# Patient Record
Sex: Female | Born: 1960 | Race: White | Hispanic: No | Marital: Married | Smoking: Never smoker
Health system: Southern US, Community
[De-identification: ages and names within clinical notes are randomized; demographics above are authoritative.]

## PROBLEM LIST (undated history)

## (undated) DIAGNOSIS — I471 Supraventricular tachycardia, unspecified: Secondary | ICD-10-CM

## (undated) DIAGNOSIS — J45990 Exercise induced bronchospasm: Secondary | ICD-10-CM

## (undated) DIAGNOSIS — R000 Tachycardia, unspecified: Secondary | ICD-10-CM

## (undated) DIAGNOSIS — R5383 Other fatigue: Secondary | ICD-10-CM

## (undated) DIAGNOSIS — L719 Rosacea, unspecified: Secondary | ICD-10-CM

## (undated) DIAGNOSIS — R0789 Other chest pain: Secondary | ICD-10-CM

## (undated) DIAGNOSIS — N951 Menopausal and female climacteric states: Secondary | ICD-10-CM

## (undated) HISTORY — DX: Menopausal and female climacteric states: N95.1

## (undated) HISTORY — DX: Supraventricular tachycardia, unspecified: I47.10

## (undated) HISTORY — DX: Rosacea, unspecified: L71.9

## (undated) HISTORY — DX: Tachycardia, unspecified: R00.0

## (undated) HISTORY — DX: Other chest pain: R07.89

## (undated) HISTORY — DX: Supraventricular tachycardia: I47.1

## (undated) HISTORY — DX: Other fatigue: R53.83

## (undated) HISTORY — DX: Exercise induced bronchospasm: J45.990

---

## 2005-07-27 ENCOUNTER — Ambulatory Visit: Payer: Self-pay | Admitting: Unknown Physician Specialty

## 2006-08-30 ENCOUNTER — Ambulatory Visit: Payer: Self-pay | Admitting: Unknown Physician Specialty

## 2007-09-06 ENCOUNTER — Ambulatory Visit: Payer: Self-pay | Admitting: Unknown Physician Specialty

## 2008-10-02 ENCOUNTER — Ambulatory Visit: Payer: Self-pay | Admitting: Unknown Physician Specialty

## 2009-03-27 ENCOUNTER — Encounter: Payer: Self-pay | Admitting: Internal Medicine

## 2009-07-11 ENCOUNTER — Encounter: Payer: Self-pay | Admitting: Internal Medicine

## 2009-08-08 ENCOUNTER — Encounter: Payer: Self-pay | Admitting: Internal Medicine

## 2009-08-11 ENCOUNTER — Encounter: Payer: Self-pay | Admitting: Internal Medicine

## 2009-09-10 ENCOUNTER — Ambulatory Visit: Payer: Self-pay | Admitting: Internal Medicine

## 2009-09-10 DIAGNOSIS — I498 Other specified cardiac arrhythmias: Secondary | ICD-10-CM

## 2009-10-06 ENCOUNTER — Ambulatory Visit: Payer: Self-pay | Admitting: Unknown Physician Specialty

## 2010-10-21 ENCOUNTER — Ambulatory Visit: Payer: Self-pay | Admitting: Unknown Physician Specialty

## 2010-12-21 ENCOUNTER — Ambulatory Visit: Payer: Self-pay | Admitting: Podiatry

## 2010-12-25 ENCOUNTER — Ambulatory Visit: Payer: Self-pay | Admitting: Podiatry

## 2011-02-26 ENCOUNTER — Ambulatory Visit: Payer: Self-pay | Admitting: Unknown Physician Specialty

## 2011-06-09 ENCOUNTER — Encounter: Payer: Self-pay | Admitting: Internal Medicine

## 2011-10-27 ENCOUNTER — Ambulatory Visit: Payer: Self-pay | Admitting: Unknown Physician Specialty

## 2012-09-21 ENCOUNTER — Ambulatory Visit: Payer: Self-pay | Admitting: Internal Medicine

## 2012-10-31 ENCOUNTER — Ambulatory Visit: Payer: Self-pay | Admitting: Internal Medicine

## 2013-11-13 ENCOUNTER — Ambulatory Visit: Payer: Self-pay | Admitting: Internal Medicine

## 2014-08-28 ENCOUNTER — Ambulatory Visit (INDEPENDENT_AMBULATORY_CARE_PROVIDER_SITE_OTHER): Payer: BC Managed Care – PPO

## 2014-08-28 ENCOUNTER — Ambulatory Visit (INDEPENDENT_AMBULATORY_CARE_PROVIDER_SITE_OTHER): Payer: BC Managed Care – PPO | Admitting: Podiatry

## 2014-08-28 ENCOUNTER — Encounter: Payer: Self-pay | Admitting: Podiatry

## 2014-08-28 VITALS — BP 121/65 | HR 61 | Resp 16 | Ht 67.0 in | Wt 130.0 lb

## 2014-08-28 DIAGNOSIS — D3610 Benign neoplasm of peripheral nerves and autonomic nervous system, unspecified: Secondary | ICD-10-CM

## 2014-08-28 NOTE — Progress Notes (Signed)
   Subjective:    Patient ID: Gina [place], female    DOB: 1961-03-15, 53 y.o.   MRN: [id number]  HPI Comments: Been having problems with my left foot , i am here for a second opinion  i do have a podiatrist and have been having treatment for my problem.  Having pain on the ball of foot in between the 3rd and 4th met . Been diagnosed and treated for a neuroma and the next step is surgery.  It is a sharp pain that seems to get worse when putting on my tennis shoes. And goes to a dull ache. It feels like walking on bone. I have had 4-5 alcohol injections. And it seemed to have flared up since stopping the injections, also noticed that the 4th toe seems to have moved and turning toward the other toe.   Foot Pain      Review of Systems  HENT:       Sinus problems   Allergic/Immunologic: Positive for environmental allergies and food allergies.  All other systems reviewed and are negative.      Objective:   Physical Exam: I have reviewed her past medical history medications allergies surgeries social history and review of systems. Pulses are strongly palpable bilateral. Neurologic sensorium is intact since once the monofilament she does have a palpable Mulder's click to the third interdigital space of the left foot. Deep tendon reflexes are intact bilateral muscle strength is 5 over 5 dorsiflexors plantar flexors inverters everters all intrinsic musculature is intact. Orthopedic evaluation Mr. is all joints distal to the ankle a full range of motion without crepitation. Cutaneous evaluation demonstrates supple well hydrated cutis no erythema edema saline is drainage or odor. Radiographic evaluation confirms a second metatarsal osteotomy with screw fixation a capital osteotomy with a single K wire fixation left foot as well as the arthroplasty fifth toe left foot.        Assessment & Plan:  Assessment: Neuroma third digitleft foot.  Plan: Discussed etiology pathology conservative  versus surgical therapies. At this point all conservative therapies have failed and surgery is indicated we discussed in great detail today surgical intervention consisting of 2 methods: 1. A deep intermetatarsal ligament release. 2. Excision of the neuroma. We discussed this in great detail today she understands the pros and cons of both like to have this performed but she will more than likely have her initial surgeon to work.

## 2014-10-11 IMAGING — US ABDOMEN ULTRASOUND LIMITED
1 series · 14 of 25 positions shown · non-contrast
Comparison: none

REASON FOR EXAM: LIVER elevated liver function
COMMENTS:

[Series 1: abdomen ultrasound limited · 0.20mm/px · 14 of 56 slices shown]
[im 1/56]
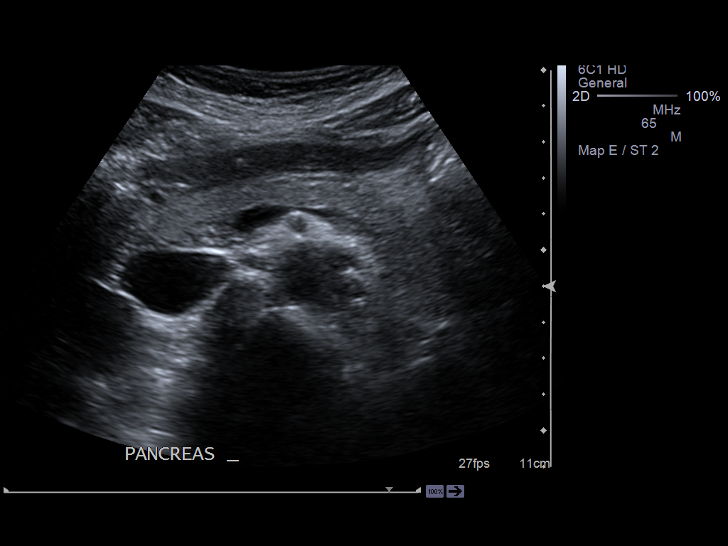
[im 5/56]
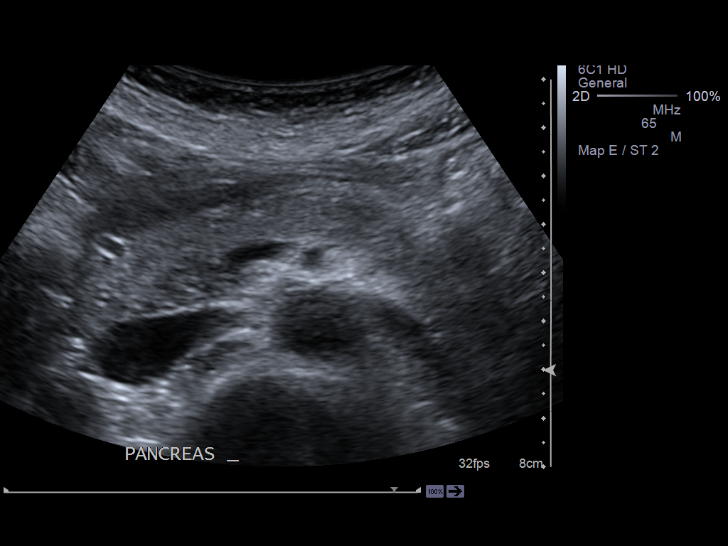
[im 10/56]
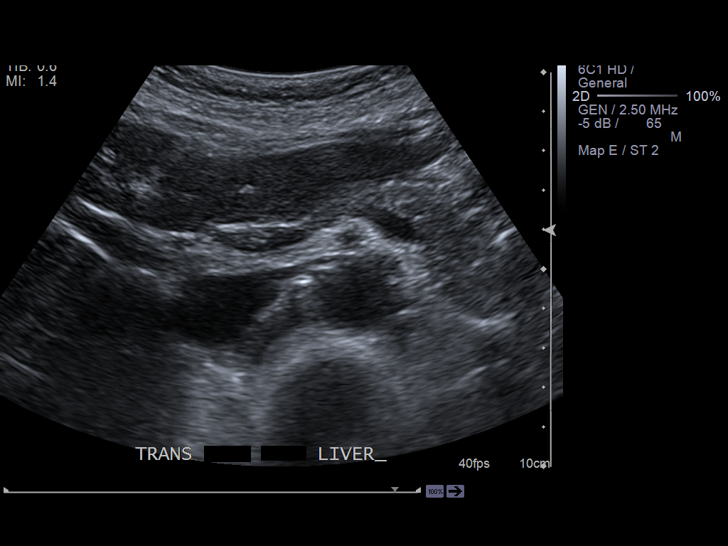
[im 14/56]
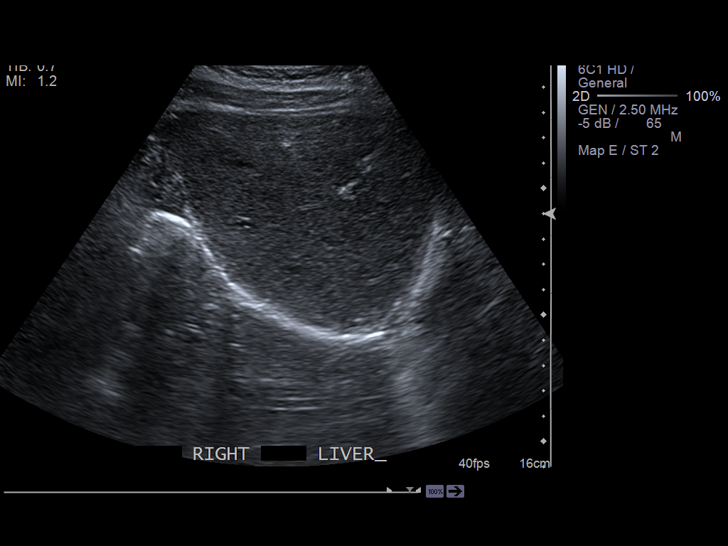
[im 19/56]
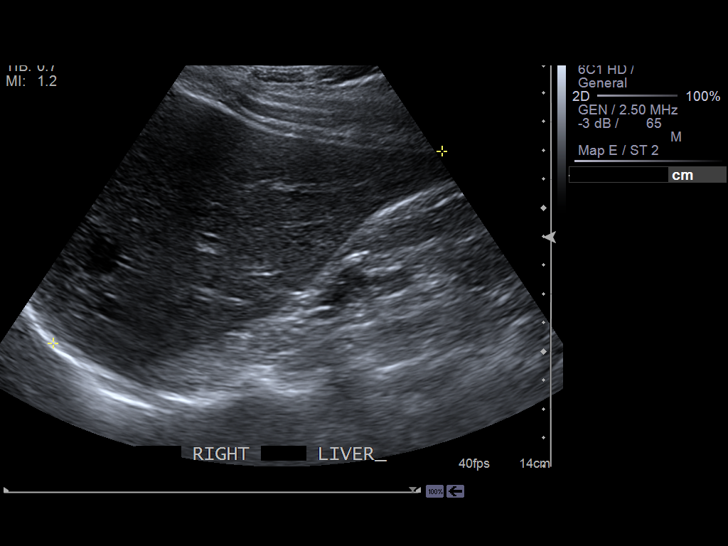
[im 21/56]
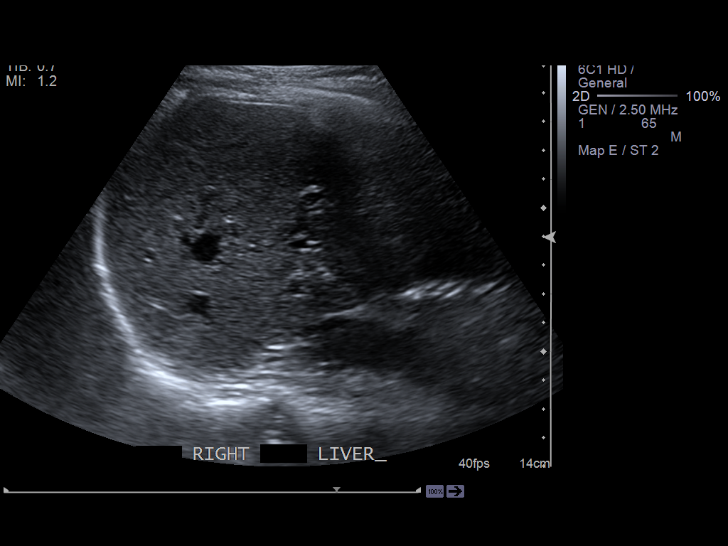
[im 26/56]
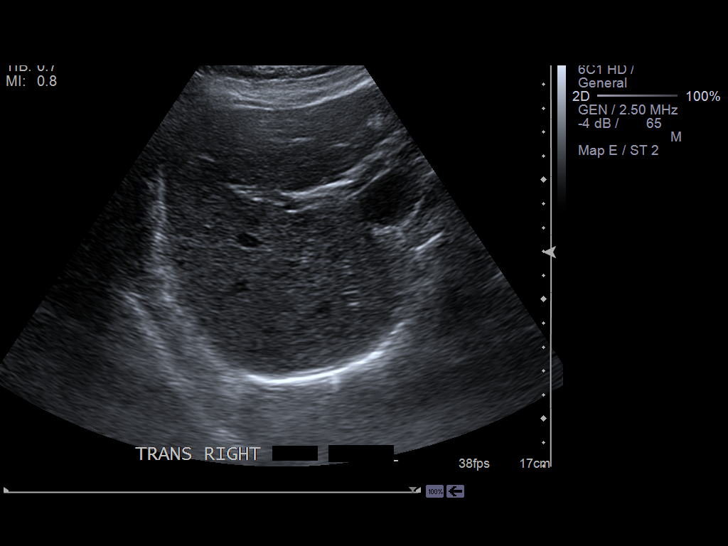
[im 30/56]
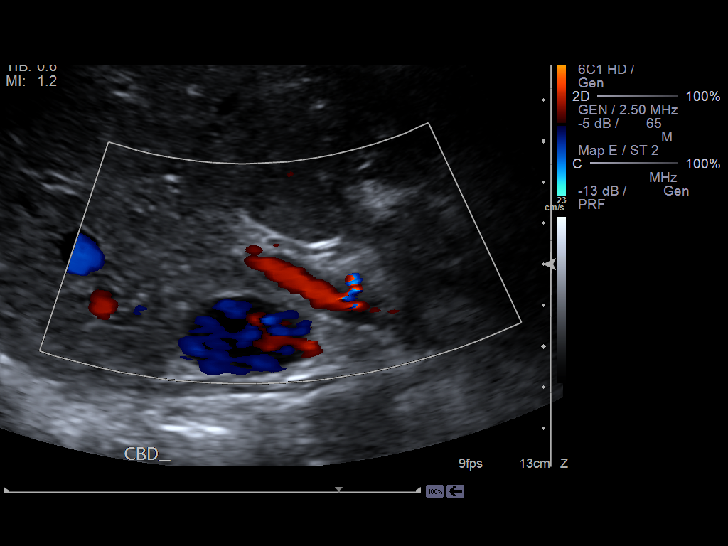
[im 35/56]
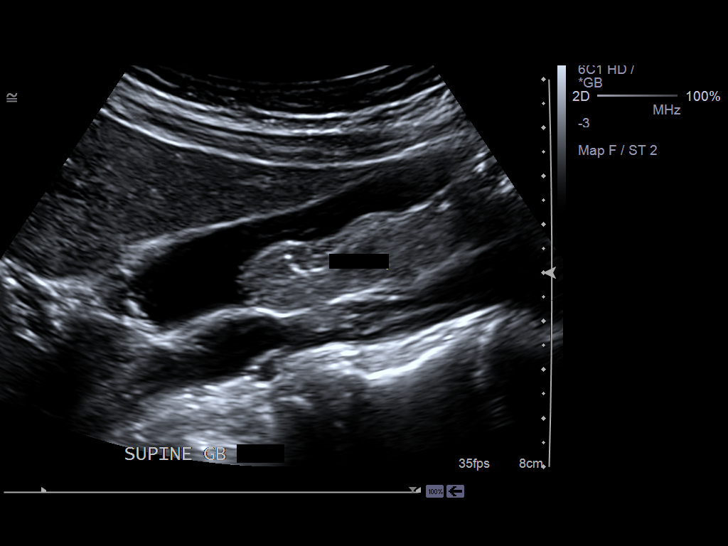
[im 37/56]
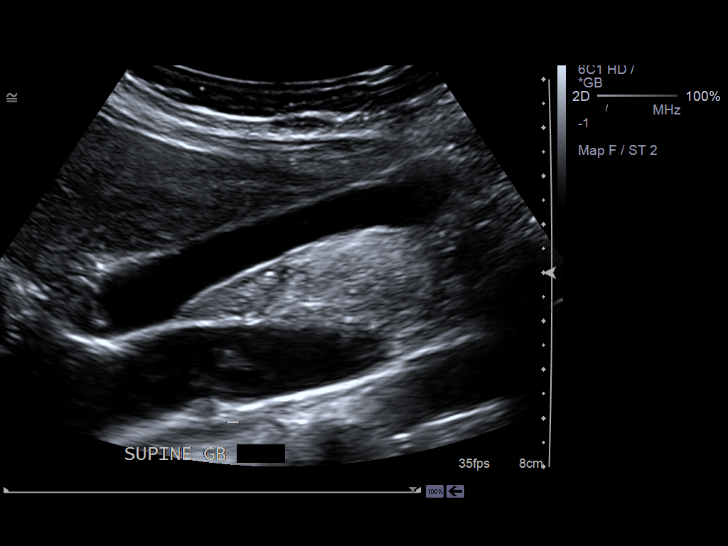
[im 42/56]
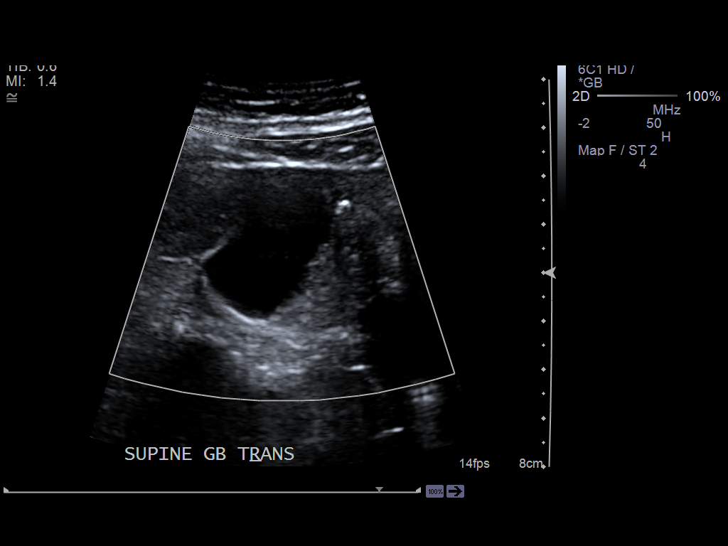
[im 46/56]
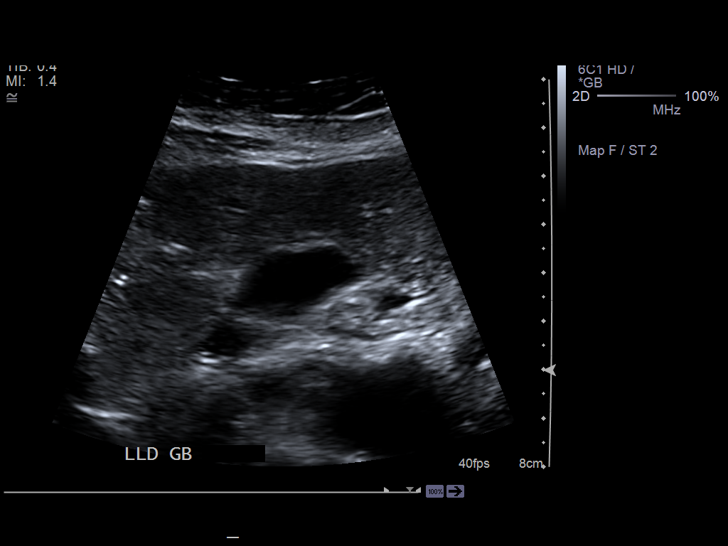
[im 51/56]
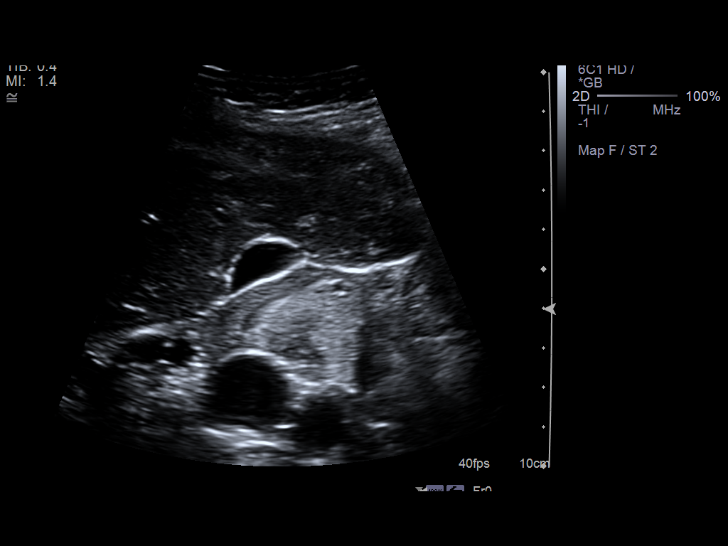
[im 56/56]
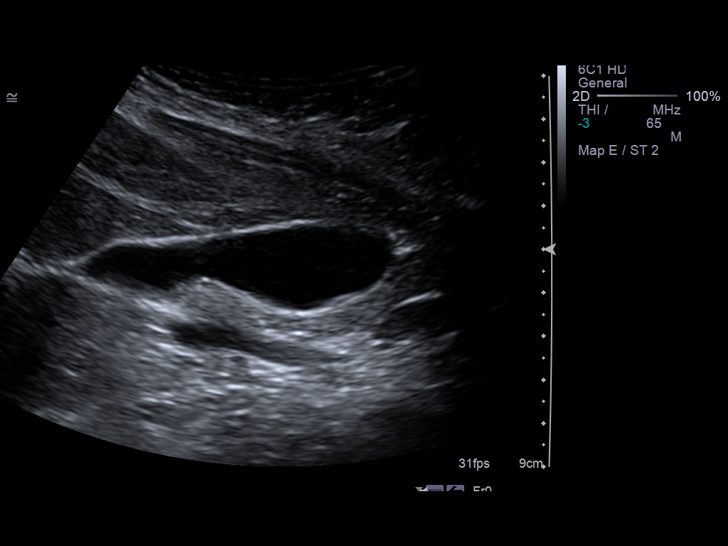

[14 of 25 positions shown; findings below may reference images not displayed]

PROCEDURE:     US  - US ABDOMEN LIMITED SURVEY  - September 21, 2012 [DATE]

RESULT:     A limited right upper quadrant ultrasound was performed.

The gallbladder is adequately distended with no evidence of stones, wall
thickening, or pericholecystic fluid. There is no positive sonographic
Murphy's sign. The common bile duct is normal at 4.3 mm in diameter.

The liver exhibits no focal mass or ductal dilation. Portal venous flow is
normal in direction toward the liver. The observed portions of the spleen
are normal.
IMPRESSION: The liver and gallbladder and observed portions of the
pancreas are normal in appearance.

[REDACTED]

## 2014-12-03 ENCOUNTER — Ambulatory Visit: Payer: Self-pay | Admitting: Internal Medicine

## 2014-12-17 ENCOUNTER — Ambulatory Visit: Payer: Self-pay | Admitting: Internal Medicine

## 2015-12-17 ENCOUNTER — Other Ambulatory Visit: Payer: Self-pay | Admitting: Internal Medicine

## 2015-12-17 DIAGNOSIS — Z1231 Encounter for screening mammogram for malignant neoplasm of breast: Secondary | ICD-10-CM

## 2015-12-24 ENCOUNTER — Ambulatory Visit: Payer: Self-pay

## 2015-12-25 ENCOUNTER — Ambulatory Visit
Admission: RE | Admit: 2015-12-25 | Discharge: 2015-12-25 | Disposition: A | Discharge status: Home / Self Care | Payer: BLUE CROSS/BLUE SHIELD | Source: Ambulatory Visit | Attending: Internal Medicine | Admitting: Internal Medicine

## 2015-12-25 ENCOUNTER — Ambulatory Visit: Payer: Self-pay

## 2015-12-25 DIAGNOSIS — Z1231 Encounter for screening mammogram for malignant neoplasm of breast: Secondary | ICD-10-CM | POA: Insufficient documentation

## 2015-12-30 ENCOUNTER — Other Ambulatory Visit: Payer: Self-pay | Admitting: Internal Medicine

## 2015-12-30 DIAGNOSIS — R928 Other abnormal and inconclusive findings on diagnostic imaging of breast: Secondary | ICD-10-CM

## 2015-12-31 ENCOUNTER — Ambulatory Visit
Admission: RE | Admit: 2015-12-31 | Discharge: 2015-12-31 | Disposition: A | Discharge status: Home / Self Care | Payer: BLUE CROSS/BLUE SHIELD | Source: Ambulatory Visit | Attending: Internal Medicine | Admitting: Internal Medicine

## 2015-12-31 DIAGNOSIS — R928 Other abnormal and inconclusive findings on diagnostic imaging of breast: Secondary | ICD-10-CM | POA: Insufficient documentation

## 2016-12-22 ENCOUNTER — Other Ambulatory Visit: Payer: Self-pay | Admitting: Internal Medicine

## 2016-12-22 DIAGNOSIS — Z1231 Encounter for screening mammogram for malignant neoplasm of breast: Secondary | ICD-10-CM

## 2017-01-19 ENCOUNTER — Ambulatory Visit: Payer: BLUE CROSS/BLUE SHIELD

## 2017-02-14 ENCOUNTER — Ambulatory Visit
Admission: RE | Admit: 2017-02-14 | Discharge: 2017-02-14 | Disposition: A | Discharge status: Home / Self Care | Payer: BLUE CROSS/BLUE SHIELD | Source: Ambulatory Visit | Attending: Internal Medicine | Admitting: Internal Medicine

## 2017-02-14 ENCOUNTER — Ambulatory Visit: Payer: BLUE CROSS/BLUE SHIELD

## 2017-02-14 ENCOUNTER — Other Ambulatory Visit: Payer: Self-pay | Admitting: Internal Medicine

## 2017-02-14 DIAGNOSIS — Z1231 Encounter for screening mammogram for malignant neoplasm of breast: Secondary | ICD-10-CM

## 2017-12-16 DEATH — deceased

## 2018-01-19 ENCOUNTER — Other Ambulatory Visit: Payer: Self-pay | Admitting: Internal Medicine

## 2018-01-19 DIAGNOSIS — Z1231 Encounter for screening mammogram for malignant neoplasm of breast: Secondary | ICD-10-CM

## 2018-02-16 ENCOUNTER — Ambulatory Visit
Admission: RE | Admit: 2018-02-16 | Discharge: 2018-02-16 | Disposition: A | Discharge status: Home / Self Care | Payer: BLUE CROSS/BLUE SHIELD | Source: Ambulatory Visit | Attending: Internal Medicine | Admitting: Internal Medicine

## 2018-02-16 DIAGNOSIS — Z1231 Encounter for screening mammogram for malignant neoplasm of breast: Secondary | ICD-10-CM | POA: Diagnosis present

## 2019-01-24 ENCOUNTER — Other Ambulatory Visit: Payer: Self-pay | Admitting: Internal Medicine

## 2019-01-24 DIAGNOSIS — Z1231 Encounter for screening mammogram for malignant neoplasm of breast: Secondary | ICD-10-CM

## 2020-01-28 ENCOUNTER — Other Ambulatory Visit: Payer: Self-pay | Admitting: Internal Medicine

## 2020-01-28 DIAGNOSIS — Z1231 Encounter for screening mammogram for malignant neoplasm of breast: Secondary | ICD-10-CM

## 2020-03-24 ENCOUNTER — Ambulatory Visit
Admission: RE | Admit: 2020-03-24 | Discharge: 2020-03-24 | Disposition: A | Discharge status: Home / Self Care | Payer: BC Managed Care – PPO | Source: Ambulatory Visit | Attending: Internal Medicine | Admitting: Internal Medicine

## 2020-03-24 DIAGNOSIS — Z1231 Encounter for screening mammogram for malignant neoplasm of breast: Secondary | ICD-10-CM | POA: Insufficient documentation

## 2020-05-20 ENCOUNTER — Other Ambulatory Visit: Payer: Self-pay

## 2020-05-20 ENCOUNTER — Ambulatory Visit: Payer: BC Managed Care – PPO | Admitting: Dermatology

## 2020-05-20 DIAGNOSIS — B070 Plantar wart: Secondary | ICD-10-CM | POA: Diagnosis not present

## 2020-05-20 DIAGNOSIS — D225 Melanocytic nevi of trunk: Secondary | ICD-10-CM | POA: Diagnosis not present

## 2020-05-20 DIAGNOSIS — D485 Neoplasm of uncertain behavior of skin: Secondary | ICD-10-CM

## 2020-05-20 DIAGNOSIS — L813 Cafe au lait spots: Secondary | ICD-10-CM

## 2020-05-20 DIAGNOSIS — L719 Rosacea, unspecified: Secondary | ICD-10-CM | POA: Diagnosis not present

## 2020-05-20 DIAGNOSIS — Z1283 Encounter for screening for malignant neoplasm of skin: Secondary | ICD-10-CM

## 2020-05-20 DIAGNOSIS — L814 Other melanin hyperpigmentation: Secondary | ICD-10-CM

## 2020-05-20 DIAGNOSIS — L578 Other skin changes due to chronic exposure to nonionizing radiation: Secondary | ICD-10-CM

## 2020-05-20 DIAGNOSIS — L821 Other seborrheic keratosis: Secondary | ICD-10-CM

## 2020-05-20 DIAGNOSIS — D229 Melanocytic nevi, unspecified: Secondary | ICD-10-CM

## 2020-05-20 DIAGNOSIS — D1800 Hemangioma unspecified site: Secondary | ICD-10-CM

## 2020-05-20 DIAGNOSIS — D235 Other benign neoplasm of skin of trunk: Secondary | ICD-10-CM

## 2020-05-20 MED ORDER — TRETINOIN 0.1 % EX CREA: TOPICAL_CREAM | Freq: Every evening | CUTANEOUS | 6 refills | Status: DC

## 2020-05-20 MED ORDER — SOOLANTRA 1 % EX CREA: TOPICAL_CREAM | CUTANEOUS | 11 refills | Status: DC

## 2020-05-20 MED ORDER — METRONIDAZOLE 1 % EX GEL: 1.0000 "application " | Freq: Every day | CUTANEOUS | 11 refills | Status: DC

## 2020-05-20 MED ORDER — DOXYCYCLINE 40 MG PO CPDR: 40.0000 mg | DELAYED_RELEASE_CAPSULE | Freq: Every day | ORAL | 11 refills | Status: DC

## 2020-05-20 NOTE — Patient Instructions (Signed)

## 2020-05-20 NOTE — Progress Notes (Signed)
Follow-Up Visit   Subjective  Gina [place] is a 59 y.o. female who presents for the following: Annual Exam, Rosacea (controlled with metronidazole 1% gel, Soolantra Cream, and Oracea 40mg .), wart (Left plantar foot, has been treated with LN2 in the past), and bump (left knee x ~2 years).  Gets irritated.  The following portions of the chart were reviewed this encounter and updated as appropriate:      Review of Systems:  No other skin or systemic complaints except as noted in HPI or Assessment and Plan.  Objective  Well appearing patient in no apparent distress; mood and affect are within normal limits.  A full examination was performed including scalp, head, eyes, ears, nose, lips, neck, chest, axillae, abdomen, back, buttocks, bilateral upper extremities, bilateral lower extremities, hands, feet, fingers, toes, fingernails, and toenails. All findings within normal limits unless otherwise noted below.  Objective  Face: Mild erythema on nasal tip.  Objective  Spinal Lower Back: 2.18mm med dark brown macule   Objective  Left Upper Lateral Knee: 5.20mm Firm light brown nodule.  Objective  Left Plantar Foot at [facility]: 2.27mm firm depressed macule.   Assessment & Plan  Rosacea Face  Controlled on treatment Continue metronidazole 1% gel QAM Continue Soolantra Cream QHS Continue doxycycline 40mg  take 1 po QD Continue tretinoin 0.1% cream Apply QHS as tolerated. Sunscreen qam  Doxycycline should be taken with food to prevent nausea. Do not lay down for 30 minutes after taking. Be cautious with sun exposure and use good sun protection while on this medication. Pregnant women should not take this medication.   Topical retinoid medications like tretinoin/Retin-A, adapalene/Differin, tazarotene/Fabior, and Epiduo/Epiduo Forte can cause dryness and irritation when first started. Only apply a pea-sized amount to the entire affected area. Avoid applying it around the eyes,  edges of mouth and creases at the nose. If you experience irritation, use a good moisturizer first and/or apply the medicine less often. If you are doing well with the medicine, you can increase how often you use it until you are applying every night. Be careful with sun protection while using this medication as it can make you sensitive to the sun. This medicine should not be used by pregnant women.     Ivermectin (SOOLANTRA) 1 % CREA - Face  tretinoin (RETIN-A) 0.1 % cream - Face  Nevus Spinal Lower Back  Benign-appearing.  Observation.  Call clinic for new or changing moles.  Recommend daily use of broad spectrum spf 30+ sunscreen to sun-exposed areas.    Neoplasm of uncertain behavior of skin Left Upper Lateral Knee  Epidermal / dermal shaving  Lesion diameter (cm):  0.5 Informed consent: discussed and consent obtained   Patient was prepped and draped in usual sterile fashion: Area prepped with alcohol. Anesthesia: the lesion was anesthetized in a standard fashion   Anesthetic:  1% lidocaine w/ epinephrine 1-100,000 buffered w/ 8.4% NaHCO3 Instrument used: flexible razor blade   Hemostasis achieved with: pressure, aluminum chloride and electrodesiccation   Outcome: patient tolerated procedure well   Post-procedure details: wound care instructions given   Post-procedure details comment:  Ointment and small bandage applied Additional details:  Post tx defect 0.7cm.  Specimen 1 - Surgical pathology Differential Diagnosis: Irritated Dermatofibroma vs other Check Margins: No Firm papule with dimple, 5.71mm  Discussed risk of recurrence and resulting scar. If recurs, may need to excise.  Plantar wart Left Plantar Foot at Ball  Vs Callous  Discussed viral etiology and risk of  spread.  Discussed multiple treatments may be required to clear warts.  Discussed possible post-treatment dyspigmentation and risk of recurrence.   Destruction of lesion - Left Plantar Foot at  Ball  Destruction method: cryotherapy   Informed consent: discussed and consent obtained   Lesion destroyed using liquid nitrogen: Yes   Region frozen until ice ball extended beyond lesion: Yes   Outcome: patient tolerated procedure well with no complications   Post-procedure details: wound care instructions given   Additional details:  Paring performed today.   Skin cancer screening performed today.  Actinic Damage - diffuse scaly erythematous macules with underlying dyspigmentation - Recommend daily broad spectrum sunscreen SPF 30+ to sun-exposed areas, reapply every 2 hours as needed.  - Call for new or changing lesions.  Melanocytic Nevi - Tan-brown and/or pink-flesh-colored symmetric macules and papules - R eyebrow - Benign appearing on exam today - Observation - Call clinic for new or changing moles - Recommend daily use of broad spectrum spf 30+ sunscreen to sun-exposed areas.   Lentigines - Scattered tan macules - Discussed due to sun exposure - Benign, observe - Call for any changes  Seborrheic Keratoses - Stuck-on, waxy, tan-brown papules and plaques  - Discussed benign etiology and prognosis. - Observe - Call for any changes  Hemangiomas - Red papules - Discussed benign nature - Observe - Call for any changes  Dermatofibroma - Firm pink/brown papulenodule with dimple sign on spinal lower back - Benign appearing - Call for any changes  Cafe au Lait  - Tan patch  - Genetic - Benign, observe - Call for any changes   Return in about 1 year (around 05/20/2021) for TBSE.  IJamesetta Orleans, CMA, am acting as scribe for Brendolyn Patty, MD .  Documentation: I have reviewed the above documentation for accuracy and completeness, and I agree with the above.  Brendolyn Patty MD

## 2020-05-22 ENCOUNTER — Telehealth: Payer: Self-pay

## 2020-05-22 NOTE — Telephone Encounter (Signed)
-----   Message from Brendolyn Patty, MD sent at 05/22/2020  8:46 AM EDT ----- Skin , left upper lateral knee PILAR CYST  Benign cyst

## 2020-05-22 NOTE — Telephone Encounter (Signed)
Advised pt of bx results/sh ?

## 2020-09-03 ENCOUNTER — Other Ambulatory Visit: Payer: Self-pay | Admitting: Dermatology

## 2021-01-14 ENCOUNTER — Other Ambulatory Visit: Payer: Self-pay

## 2021-01-14 ENCOUNTER — Ambulatory Visit: Payer: BC Managed Care – PPO | Admitting: Dermatology

## 2021-01-14 DIAGNOSIS — I8393 Asymptomatic varicose veins of bilateral lower extremities: Secondary | ICD-10-CM

## 2021-01-14 DIAGNOSIS — H01131 Eczematous dermatitis of right upper eyelid: Secondary | ICD-10-CM

## 2021-01-14 DIAGNOSIS — H01134 Eczematous dermatitis of left upper eyelid: Secondary | ICD-10-CM | POA: Diagnosis not present

## 2021-01-14 MED ORDER — PIMECROLIMUS 1 % EX CREA: TOPICAL_CREAM | Freq: Two times a day (BID) | CUTANEOUS | 3 refills | Status: DC

## 2021-01-14 NOTE — Progress Notes (Signed)
   Follow-Up Visit   Subjective  Gina Gonzales is a 60 y.o. female who presents for the following: Rash (Of upper eyelids x ~ 3 months - has used husband's ketoconazole shampoo and Desonide lotion) and Other (Veins of legs - would like to discuss treatment options).  The following portions of the chart were reviewed this encounter and updated as appropriate:   Tobacco  Allergies  Meds  Problems  Med Hx  Surg Hx  Fam Hx     Review of Systems:  No other skin or systemic complaints except as noted in HPI or Assessment and Plan.  Objective  Well appearing patient in no apparent distress; mood and affect are within normal limits.  A focused examination was performed including face, legs. Relevant physical exam findings are noted in the Assessment and Plan.  Objective  Bilateral upper eyelids: Pinkness with edema  Objective  Left Lower Leg - Anterior: Spider veins   Assessment & Plan  Eczematous dermatitis of upper eyelids of both eyes Bilateral upper eyelids Will plan patch testing in the future if not improving. pimecrolimus (ELIDEL) 1 % cream - Bilateral upper eyelids  Atopic dermatitis vs Contact Dermatitis vs Seborrheic Dermatitis Chronic and persistent.  Spider veins of both lower extremities Left Lower Leg - Anterior  Discussed sclerotherapy for spider vein treatment.  Discussed that it is a cosmetic procedure and is not covered by insurance ($350/treatment).  Treatment consists of injecting the spider veins with a medicine called Asclera to help them disappear.  Multiple treatments are generally necessary to get best results.  Risks including bruising and persistent discoloration due to post-inflammatory hyperpigmentation.  Sclerotherapy does not prevent the development of new spider veins.  Daily compression hose for two weeks after procedure is recommended.   Return for 5-6 weeks on a Monday or Tuesday.  I, Ashok Cordia, CMA, am acting as scribe for Sarina Ser, MD .  Documentation: I have reviewed the above documentation for accuracy and completeness, and I agree with the above.  Sarina Ser, MD

## 2021-01-15 ENCOUNTER — Encounter: Payer: Self-pay | Admitting: Dermatology

## 2021-02-09 ENCOUNTER — Ambulatory Visit: Payer: BC Managed Care – PPO | Admitting: Dermatology

## 2021-02-16 ENCOUNTER — Other Ambulatory Visit: Payer: Self-pay | Admitting: Internal Medicine

## 2021-02-16 ENCOUNTER — Ambulatory Visit: Payer: BC Managed Care – PPO | Admitting: Dermatology

## 2021-02-16 DIAGNOSIS — Z1231 Encounter for screening mammogram for malignant neoplasm of breast: Secondary | ICD-10-CM

## 2021-03-02 ENCOUNTER — Ambulatory Visit: Payer: BC Managed Care – PPO | Admitting: Dermatology

## 2021-04-07 ENCOUNTER — Ambulatory Visit
Admission: RE | Admit: 2021-04-07 | Discharge: 2021-04-07 | Disposition: A | Discharge status: Home / Self Care | Payer: BC Managed Care – PPO | Source: Ambulatory Visit | Attending: Internal Medicine | Admitting: Internal Medicine

## 2021-04-07 ENCOUNTER — Other Ambulatory Visit: Payer: Self-pay

## 2021-04-07 DIAGNOSIS — Z1231 Encounter for screening mammogram for malignant neoplasm of breast: Secondary | ICD-10-CM | POA: Insufficient documentation

## 2021-04-14 ENCOUNTER — Ambulatory Visit (INDEPENDENT_AMBULATORY_CARE_PROVIDER_SITE_OTHER): Payer: BC Managed Care – PPO

## 2021-04-14 ENCOUNTER — Ambulatory Visit: Payer: BC Managed Care – PPO | Admitting: Podiatry

## 2021-04-14 ENCOUNTER — Other Ambulatory Visit: Payer: Self-pay | Admitting: Dermatology

## 2021-04-14 ENCOUNTER — Other Ambulatory Visit: Payer: Self-pay

## 2021-04-14 DIAGNOSIS — S92504A Nondisplaced unspecified fracture of right lesser toe(s), initial encounter for closed fracture: Secondary | ICD-10-CM

## 2021-04-14 NOTE — Progress Notes (Signed)
   HPI: 60 y.o. female presenting today as a new patient for evaluation of an injury that the patient sustained to the right fifth toe approximately 3 weeks ago.  Patient states that she hit her toe against a piece of furniture.  It has been painful and swollen ever since.  She presents for further treatment and evaluation  Past Medical History:  Diagnosis Date  . Atypical chest pain   . Exercise-induced asthma   . Fatigue    hx  . Perimenopause   . Rosacea   . SVT (supraventricular tachycardia) ([place])   . Tachycardia      Physical Exam: General: The patient is alert and oriented x3 in no acute distress.  Dermatology: Skin is warm, dry and supple bilateral lower extremities. Negative for open lesions or macerations.  Vascular: Palpable pedal pulses bilaterally.  There is some edema localized to the right fifth toe.  No significant ecchymosis  Neurological: Epicritic and protective threshold grossly intact bilaterally.   Musculoskeletal Exam: No pedal deformities noted bilateral.  There is some associated tenderness to palpation to the right fifth toe  Radiographic Exam:  Normal osseous mineralization.  Closed, nondisplaced fracture of the proximal phalanx right fifth toe noted.  Overall the toes in good alignment however  Assessment: 1.  Fracture right fifth toe, closed, nondisplaced, initial encounter   Plan of Care:  1. Patient evaluated. X-Rays reviewed.  2.  Postsurgical shoe dispensed.  Weightbearing as tolerated 3.  Recommend ibuprofen as needed 4.  Explained to the patient that she may still be active however she may need to modify her exercises and daily activities 5.  Return to clinic in 6 weeks for follow-up x-ray      Edrick Kins, DPM [facility]  Dr. Edrick Kins, DPM    [address], [place] [address]                Office [phone]  Fax [phone]

## 2021-05-26 ENCOUNTER — Ambulatory Visit: Payer: BC Managed Care – PPO | Admitting: Podiatry

## 2021-06-02 ENCOUNTER — Ambulatory Visit: Payer: BC Managed Care – PPO | Admitting: Dermatology

## 2021-06-16 ENCOUNTER — Other Ambulatory Visit: Payer: Self-pay | Admitting: Dermatology

## 2021-06-16 DIAGNOSIS — L719 Rosacea, unspecified: Secondary | ICD-10-CM

## 2021-06-22 ENCOUNTER — Other Ambulatory Visit: Payer: Self-pay | Admitting: Dermatology

## 2021-06-22 DIAGNOSIS — L719 Rosacea, unspecified: Secondary | ICD-10-CM

## 2021-07-21 ENCOUNTER — Other Ambulatory Visit: Payer: Self-pay

## 2021-07-21 ENCOUNTER — Ambulatory Visit: Payer: BC Managed Care – PPO | Admitting: Dermatology

## 2021-07-21 DIAGNOSIS — L814 Other melanin hyperpigmentation: Secondary | ICD-10-CM

## 2021-07-21 DIAGNOSIS — L578 Other skin changes due to chronic exposure to nonionizing radiation: Secondary | ICD-10-CM | POA: Diagnosis not present

## 2021-07-21 DIAGNOSIS — D229 Melanocytic nevi, unspecified: Secondary | ICD-10-CM

## 2021-07-21 DIAGNOSIS — L810 Postinflammatory hyperpigmentation: Secondary | ICD-10-CM

## 2021-07-21 DIAGNOSIS — L719 Rosacea, unspecified: Secondary | ICD-10-CM | POA: Diagnosis not present

## 2021-07-21 DIAGNOSIS — Z1283 Encounter for screening for malignant neoplasm of skin: Secondary | ICD-10-CM | POA: Diagnosis not present

## 2021-07-21 DIAGNOSIS — L821 Other seborrheic keratosis: Secondary | ICD-10-CM

## 2021-07-21 DIAGNOSIS — D2361 Other benign neoplasm of skin of right upper limb, including shoulder: Secondary | ICD-10-CM

## 2021-07-21 DIAGNOSIS — D235 Other benign neoplasm of skin of trunk: Secondary | ICD-10-CM

## 2021-07-21 DIAGNOSIS — D225 Melanocytic nevi of trunk: Secondary | ICD-10-CM

## 2021-07-21 DIAGNOSIS — L219 Seborrheic dermatitis, unspecified: Secondary | ICD-10-CM | POA: Diagnosis not present

## 2021-07-21 DIAGNOSIS — L820 Inflamed seborrheic keratosis: Secondary | ICD-10-CM

## 2021-07-21 MED ORDER — KETOCONAZOLE 2 % EX CREA: TOPICAL_CREAM | CUTANEOUS | 3 refills | Status: AC

## 2021-07-21 MED ORDER — TRETINOIN 0.05 % EX CREA: TOPICAL_CREAM | Freq: Every day | CUTANEOUS | 3 refills | Status: DC

## 2021-07-21 MED ORDER — METRONIDAZOLE 0.75 % EX CREA: TOPICAL_CREAM | CUTANEOUS | 6 refills | Status: DC

## 2021-07-21 MED ORDER — ORACEA 40 MG PO CPDR: DELAYED_RELEASE_CAPSULE | ORAL | 11 refills | Status: DC

## 2021-07-21 MED ORDER — HYDROCORTISONE 2.5 % EX CREA: TOPICAL_CREAM | CUTANEOUS | 3 refills | Status: AC

## 2021-07-21 MED ORDER — SOOLANTRA 1 % EX CREA: TOPICAL_CREAM | CUTANEOUS | 5 refills | Status: DC

## 2021-07-21 NOTE — Progress Notes (Signed)
Follow-Up Visit   Subjective  Gina [place] is a 60 y.o. female who presents for the following: Annual Exam (Patient presents for TBSE today. No history of skin cancer or abnormal moles. She has a bump on her right upper arm that she noticed at the beginning of the year. No symptoms. She has pink, flaky areas of the face that started in January after she had Covid. She is using Elidel Cream as needed. Areas are not itchy on face. She also has Rosacea and is controlled using Metronidazole 0.75% cream QAM, Soolantra Cream QHS, and doxycycline '40mg'$  daily. ). Patient had fillers around her chin done by Dr Stephanie Coup in [place] at the end of April this year. She is unsure of the name of filler. She had a rash come up in these areas afterwards and they dissolved the filler, which she still has a little discoloration from.    The following portions of the chart were reviewed this encounter and updated as appropriate:       Review of Systems:  No other skin or systemic complaints except as noted in HPI or Assessment and Plan.  Objective  Well appearing patient in no apparent distress; mood and affect are within normal limits.  A full examination was performed including scalp, head, eyes, ears, nose, lips, neck, chest, axillae, abdomen, back, buttocks, bilateral upper extremities, bilateral lower extremities, hands, feet, fingers, toes, fingernails, and toenails. All findings within normal limits unless otherwise noted below.  Spinal lower back 2.65m med dark brown macule     face Pink patches with scale forehead, NL folds.   Inf Chin Light pink patches BL lower chin. Photos showed violaceous plaques same locations  Left Mid Back Clustered brown waxy papules.   Assessment & Plan  Skin cancer screening performed today.  Actinic Damage - chronic, secondary to cumulative UV radiation exposure/sun exposure over time - diffuse scaly erythematous macules with underlying  dyspigmentation - Recommend daily broad spectrum sunscreen SPF 30+ to sun-exposed areas, reapply every 2 hours as needed.  - Recommend staying in the shade or wearing long sleeves, sun glasses (UVA+UVB protection) and wide brim hats (4-inch brim around the entire circumference of the hat). - Call for new or changing lesions.  Lentigines - Scattered tan macules - Due to sun exposure - Benign-appering, observe - Recommend daily broad spectrum sunscreen SPF 30+ to sun-exposed areas, reapply every 2 hours as needed. - Call for any changes  Seborrheic Keratoses - Stuck-on, waxy, tan-brown papules and/or plaques  - Benign-appearing - Discussed benign etiology and prognosis. - Observe - Call for any changes  Dermatofibroma - Firm pink/brown papulenodule with dimple sign of the spinal lower back, right upper arm - Benign appearing - Call for any changes  Melanocytic Nevi - Tan-brown and/or pink-flesh-colored symmetric macules and papules of the spinal lower back - Benign appearing on exam today - Observation - Call clinic for new or changing moles - Recommend daily use of broad spectrum spf 30+ sunscreen to sun-exposed areas.   Rosacea face  Controlled on treatment  Rosacea is a chronic progressive skin condition usually affecting the face of adults, causing redness and/or acne bumps. It is treatable but not curable. It sometimes affects the eyes (ocular rosacea) as well. It may respond to topical and/or systemic medication and can flare with stress, sun exposure, alcohol, exercise and some foods.  Daily application of broad spectrum spf 30+ sunscreen to face is recommended to reduce flares.   Continue metronidazole 0.75%  Cream QAM Continue Soolantra Cream QHS Continue doxycycline '40mg'$  take 1 po QD Decrease tretinoin 0.05% cream for now until other facial rash resolved, then apply QHS as tolerated. Sunscreen qam  tretinoin (RETIN-A) 0.05 % cream - face Apply topically at  bedtime.  Related Medications tretinoin (RETIN-A) 0.1 % cream APPLY TOPICALLY AS DIRECTED AT BEDTIME  SOOLANTRA 1 % CREA APPLY TO FACE EVERY EVENING FOR ROSACEA  Nevus Spinal lower back  Benign-appearing.  Stable. Observation.  Call clinic for new or changing moles.  Recommend daily use of broad spectrum spf 30+ sunscreen to sun-exposed areas.   Seborrheic dermatitis face  Seborrheic Dermatitis with flare -  is a chronic persistent rash characterized by pinkness and scaling most commonly of the mid face but also can occur on the scalp (dandruff), ears; mid chest, mid back and groin.  It tends to be exacerbated by stress and cooler weather.  People who have neurologic disease may experience new onset or exacerbation of existing seborrheic dermatitis.  The condition is not curable but treatable and can be controlled.  Start ketoconazole 2% cream Apply qd/bid as directed  Start hydrocortisone 2.5% cream Apply qd/bid as directed dsp 30g  Mix hydrocortisone with ketaconazole 2% twice a day. If improved, decrease to hydrocortisone and ketaconazole mixed once a day. If still clear, decrease to ketaconazole only.  May use Elidel Cream (pt has) Prn flares in place of HC Cream.   ketoconazole (NIZORAL) 2 % cream - face Apply 1-2 times a day to affected areas face as directed.  hydrocortisone 2.5 % cream - face Apply 1-2 times a day to affected areas face as directed.  Post-inflammatory hyperpigmentation Inf Chin  Secondary to inflammatory reaction to previous filler injection done at another practice. Possible allergic reaction?  Pt had Covid months before filler injection, so doubt reaction from that.  Has had other fillers injected here in past without reaction  Patient will find out filler used to avoid for future use.   Inflamed seborrheic keratosis Left Mid Back  Residual post cryotherapy  Not bothersome. Will observe for now.   Return in about 1 year (around 07/21/2022) for  TBSE, rosacea, seb derm.  IJamesetta Orleans, CMA, am acting as scribe for Brendolyn Patty, MD . Documentation: I have reviewed the above documentation for accuracy and completeness, and I agree with the above.  Brendolyn Patty MD

## 2021-07-21 NOTE — Patient Instructions (Addendum)
VYCROSS - Voluma, Juvederm, Vollure, Volbella  Seborrheic Dermatitis  Mix hydrocortisone with ketaconazole 2% twice a day. If improved, decrease to hydrocortisone and ketaconazole mixed once a day. If still clear, decrease to ketaconazole only.  If you have any questions or concerns for your doctor, please call our main line at [phone] and press option 4 to reach your doctor's medical assistant. If no one answers, please leave a voicemail as directed and we will return your call as soon as possible. Messages left after 4 pm will be answered the following business day.   You may also send [place] a message via [place]. We typically respond to MyChart messages within 1-2 business days.  For prescription refills, please ask your pharmacy to contact our office. Our fax number is [phone].  If you have an urgent issue when the clinic is closed that cannot wait until the next business day, you can page your doctor at the number below.    Please note that while we do our best to be available for urgent issues outside of office hours, we are not available 24/7.   If you have an urgent issue and are unable to reach [place], you may choose to seek medical care at your doctor's office, retail clinic, urgent care center, or emergency room.  If you have a medical emergency, please immediately call 911 or go to the emergency department.  Pager Numbers  - Dr. Nehemiah Massed: [phone]  - Dr. Laurence Ferrari: [phone]  - Dr. Nicole Kindred: [phone]  In the event of inclement weather, please call our main line at [phone] for an update on the status of any delays or closures.  Dermatology Medication Tips: Please keep the boxes that topical medications come in in order to help keep track of the instructions about where and how to use these. Pharmacies typically print the medication instructions only on the boxes and not directly on the medication tubes.   If your medication is too expensive, please contact our  office at [phone] option 4 or send [place] a message through [place].   We are unable to tell what your co-pay for medications will be in advance as this is different depending on your insurance coverage. However, we may be able to find a substitute medication at lower cost or fill out paperwork to get insurance to cover a needed medication.   If a prior authorization is required to get your medication covered by your insurance company, please allow [place] 1-2 business days to complete this process.  Drug prices often vary depending on where the prescription is filled and some pharmacies may offer cheaper prices.  The website www.goodrx.com contains coupons for medications through different pharmacies. The prices here do not account for what the cost may be with help from insurance (it may be cheaper with your insurance), but the website can give you the price if you did not use any insurance.  - You can print the associated coupon and take it with your prescription to the pharmacy.  - You may also stop by our office during regular business hours and pick up a GoodRx coupon card.  - If you need your prescription sent electronically to a different pharmacy, notify our office through [facility] or by phone at [phone] option 4.

## 2021-08-19 ENCOUNTER — Ambulatory Visit (INDEPENDENT_AMBULATORY_CARE_PROVIDER_SITE_OTHER): Payer: Self-pay | Admitting: Dermatology

## 2021-08-19 ENCOUNTER — Other Ambulatory Visit: Payer: Self-pay

## 2021-08-19 DIAGNOSIS — I781 Nevus, non-neoplastic: Secondary | ICD-10-CM

## 2021-08-19 NOTE — Patient Instructions (Signed)
BEFORE YOUR APPOINTMENT FOR SCLEROTHERAPY  1. When you telephone for your appointment for the sclerotherapy procedure, please let the receptionist know that you are scheduling for the fifteen (15) minute sclerotherapy procedure not just a regular visit.  2. On the day of the procedure, please cleanse and dry the areas, but do not use any moisturizers or other products on the area(s) to be treated.  3. Bring a pair of comfortable shorts to wear during the procedure.  4. Be sure to bring your recommended graduated compression stockings with you to the office. You will be wearing them home when your visit is over. These compression hose can be purchased at most medical supply stores.  After Your Sclerotherapy Procedure  1. Please wear the graduated compression stockings for 24 hours immediately following the completion of the sclerotherapy procedure.  2. We recommend that you avoid vigorous activity as much as possible for the first twenty-four (24) hours. You can do your "normal" routine, but avoid an above normal amount of time on your feet. Elevating the legs when sitting and avoidance of vigorous leg movements or exercise in the first few days after treatment may improve your results.  3. You may remove the compression dressings (cotton balls) and tape the next morning.  4. Please continue wearing the compression stockings during waking hours for the two (2) weeks following sclerotherapy.  5. If you have any blisters, sores or ulcers or other problems following your procedure please call or return to the office immediately.     THE PROCEDURE FEE IS $350.00 PER FIFTEEN (15) MINUTE SESSION. WE REQUIRE THAT THIS PROCEDURE BE PAID FOR IN FULL ON OR BEFORE THE DATE THAT IT IS PERFORMED. WE WILL GIVE YOU A RECEIPT THAT YOU CAN FILE WITH YOUR INSURANCE COMPANY. WE GENERALLY DO NOT FILE THIS PROCEDURE WITH ANY INSURANCE COMPANY EXCEPT UNDER CERTAIN CIRCUMSTANCES WHERE PRIOR AUTHORIZATION HAS BEEN  CONFIRMED. THIS PROCEDURE IS GENERALLY CONSIDERED TO BE A COSMETIC PROCEDURE BY INSURANCE COMPANIES.  If you have any questions or concerns for your doctor, please call our main line at [phone] and press option 4 to reach your doctor's medical assistant. If no one answers, please leave a voicemail as directed and we will return your call as soon as possible. Messages left after 4 pm will be answered the following business day.   You may also send [place] a message via [place]. We typically respond to MyChart messages within 1-2 business days.  For prescription refills, please ask your pharmacy to contact our office. Our fax number is [phone].  If you have an urgent issue when the clinic is closed that cannot wait until the next business day, you can page your doctor at the number below.    Please note that while we do our best to be available for urgent issues outside of office hours, we are not available 24/7.   If you have an urgent issue and are unable to reach [place], you may choose to seek medical care at your doctor's office, retail clinic, urgent care center, or emergency room.  If you have a medical emergency, please immediately call 911 or go to the emergency department.  Pager Numbers  - Dr. Nehemiah Massed: [phone]  - Dr. Laurence Ferrari: [phone]  - Dr. Nicole Kindred: [phone]  In the event of inclement weather, please call our main line at [phone] for an update on the status of any delays or closures.  Dermatology Medication Tips: Please keep the boxes that topical medications come in  in order to help keep track of the instructions about where and how to use these. Pharmacies typically print the medication instructions only on the boxes and not directly on the medication tubes.   If your medication is too expensive, please contact our office at [phone] option 4 or send [place] a message through [place].   We are unable to tell what your co-pay for medications will be in advance  as this is different depending on your insurance coverage. However, we may be able to find a substitute medication at lower cost or fill out paperwork to get insurance to cover a needed medication.   If a prior authorization is required to get your medication covered by your insurance company, please allow [place] 1-2 business days to complete this process.  Drug prices often vary depending on where the prescription is filled and some pharmacies may offer cheaper prices.  The website www.goodrx.com contains coupons for medications through different pharmacies. The prices here do not account for what the cost may be with help from insurance (it may be cheaper with your insurance), but the website can give you the price if you did not use any insurance.  - You can print the associated coupon and take it with your prescription to the pharmacy.  - You may also stop by our office during regular business hours and pick up a GoodRx coupon card.  - If you need your prescription sent electronically to a different pharmacy, notify our office through [facility] or by phone at [phone] option 4.

## 2021-08-19 NOTE — Progress Notes (Signed)
   Follow-Up Visit   Subjective  Gina Gonzales is a 60 y.o. female who presents for the following: spider veins (Legs, pt presents for sclerotherapy).   The following portions of the chart were reviewed this encounter and updated as appropriate:   Tobacco  Allergies  Meds  Problems  Med Hx  Surg Hx  Fam Hx      Review of Systems:  No other skin or systemic complaints except as noted in HPI or Assessment and Plan.  Objective  Well appearing patient in no apparent distress; mood and affect are within normal limits.  A focused examination was performed including bil legs. Relevant physical exam findings are noted in the Assessment and Plan.  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  Procedure: The patient was counseled and understands about the effects, side effects and potential risks and complications of the sclerotherapy procedure. The patient was given the opportunity to ask questions. Asclera (polidocanol) 1% (total 1cc) was injected into the varices. In order to ensure correct placement of the catheter in the vein, I drew back slightly to give moderate blood show in the syringe. If there was any evidence or suspicion of extravasation of sclerosant, the area was immediately diluted with a large volume of 0.9% saline. A pressure dressing was applied immediately to the injected sites. The patient tolerated the procedure well without complication. The patient was instructed in post-operative compression stocking use. The patient understands to call or return  immediately if any problems noted.   Return if symptoms worsen or fail to improve.  I, Othelia Pulling, RMA, am acting as scribe for Sarina Ser, MD . Documentation: I have reviewed the above documentation for accuracy and completeness, and I agree with the above.  Sarina Ser, MD

## 2021-08-25 ENCOUNTER — Encounter: Payer: Self-pay | Admitting: Dermatology

## 2022-02-15 ENCOUNTER — Ambulatory Visit: Payer: BC Managed Care – PPO | Admitting: Dermatology

## 2022-02-15 DIAGNOSIS — H01131 Eczematous dermatitis of right upper eyelid: Secondary | ICD-10-CM | POA: Diagnosis not present

## 2022-02-15 DIAGNOSIS — L820 Inflamed seborrheic keratosis: Secondary | ICD-10-CM | POA: Diagnosis not present

## 2022-02-15 DIAGNOSIS — H01134 Eczematous dermatitis of left upper eyelid: Secondary | ICD-10-CM

## 2022-02-15 DIAGNOSIS — L719 Rosacea, unspecified: Secondary | ICD-10-CM | POA: Diagnosis not present

## 2022-02-15 DIAGNOSIS — L309 Dermatitis, unspecified: Secondary | ICD-10-CM

## 2022-02-15 DIAGNOSIS — L308 Other specified dermatitis: Secondary | ICD-10-CM | POA: Diagnosis not present

## 2022-02-15 DIAGNOSIS — L219 Seborrheic dermatitis, unspecified: Secondary | ICD-10-CM

## 2022-02-15 MED ORDER — PIMECROLIMUS 1 % EX CREA: TOPICAL_CREAM | CUTANEOUS | 3 refills | Status: DC

## 2022-02-15 MED ORDER — TRETINOIN 0.025 % EX CREA: TOPICAL_CREAM | CUTANEOUS | 6 refills | Status: DC

## 2022-02-15 NOTE — Progress Notes (Signed)
? ?Follow-Up Visit ?  ?Subjective  ?Gina Gonzales is a 61 y.o. female who presents for the following: Rash (Intermammary x 5 years off and on. Has tried Desowen lotion and antibiotic ointment, but neither helped. She gets bumps, worse with heat) and Dry skin (Under eyes x 2 weeks ago. Scaly, not itchy. Patient has allergies. ). She also has a few bumps on her right cheek. She has rosacea and uses metronidazole 0.75% cream and Soolantra Cream daily, but d/c doxycycline because she didn't know if she needed it.  ? ? ?The following portions of the chart were reviewed this encounter and updated as appropriate:  ?  ?  ? ?Review of Systems:  No other skin or systemic complaints except as noted in HPI or Assessment and Plan. ? ?Objective  ?Well appearing patient in no apparent distress; mood and affect are within normal limits. ? ?A focused examination was performed including chest, face. Relevant physical exam findings are noted in the Assessment and Plan. ? ?chest (8) ?Erythematous stuck-on, waxy papule ? ?R medial lower eyelid medial infraocular ?Erythema and scale ? ?face ?Mild erythema with tiny light pink papules of the right cheek ? ?face ?Clear today. Pt states spots keep coming back ? ? ? ?Assessment & Plan  ?Inflamed seborrheic keratosis (8) ?chest ? ?Destruction of lesion - chest ? ?Destruction method: cryotherapy   ?Informed consent: discussed and consent obtained   ?Lesion destroyed using liquid nitrogen: Yes   ?Region frozen until ice ball extended beyond lesion: Yes   ?Outcome: patient tolerated procedure well with no complications   ?Post-procedure details: wound care instructions given   ?Additional details:  Prior to procedure, discussed risks of blister formation, small wound, skin dyspigmentation, or rare scar following cryotherapy. Recommend Vaseline ointment to treated areas while healing. ? ? ?Periocular dermatitis ?R medial lower eyelid medial infraocular ? ?Likely Atopic, flare with  seasonal allergies ? ?Pimecrolimus cream Apply to AA rash under eyes twice daily until improved.  ? ? ?Eczematous dermatitis of upper eyelids of both eyes ? ?Related Medications ?pimecrolimus (ELIDEL) 1 % cream ?Apply to affected areas rash on face and under eyes once to twice daily until improved. ? ?Rosacea ?face ? ?Mild flare ? ?Rosacea is a chronic progressive skin condition usually affecting the face of adults, causing redness and/or acne bumps. It is treatable but not curable. It sometimes affects the eyes (ocular rosacea) as well. It may respond to topical and/or systemic medication and can flare with stress, sun exposure, alcohol, exercise and some foods.  Daily application of broad spectrum spf 30+ sunscreen to face is recommended to reduce flares. ? ?Continue metronidazole 0.75% cream qam face. ?Continue Soolantra Cream qhs face. ?Decrease strength of tretinoin 0.025% cream Apply a pea-sized amount to face qhs as tolerated. ?Hold Oracea for now ? ?tretinoin (RETIN-A) 0.025 % cream - face ?Apply a pea-sized amount to face nightly as tolerated. ? ?Related Medications ?tretinoin (RETIN-A) 0.1 % cream ?APPLY TOPICALLY AS DIRECTED AT BEDTIME ? ?tretinoin (RETIN-A) 0.05 % cream ?Apply topically at bedtime. ? ?SOOLANTRA 1 % CREA ?APPLY TO FACE EVERY EVENING FOR ROSACEA ? ?Seborrheic dermatitis ?face ? ?Vrs atopic dermatitis, not at goal ? ?Seborrheic Dermatitis  ?-  is a chronic persistent rash characterized by pinkness and scaling most commonly of the mid face but also can occur on the scalp (dandruff), ears; mid chest, mid back and groin.  It tends to be exacerbated by stress and cooler weather.  People who have neurologic disease  may experience new onset or exacerbation of existing seborrheic dermatitis.  The condition is not curable but treatable and can be controlled. ? ?Start Elidel Cream Apply qd/bid AAs face. ? ?Related Medications ?ketoconazole (NIZORAL) 2 % cream ?Apply 1-2 times a day to affected areas  face as directed. ? ?hydrocortisone 2.5 % cream ?Apply 1-2 times a day to affected areas face as directed. ? ? ?Return as scheduled. ? ?Documentation: I have reviewed the above documentation for accuracy and completeness, and I agree with the above. ? ?Brendolyn Patty MD  ? ? ?

## 2022-02-15 NOTE — Patient Instructions (Addendum)
Pimecrolimus Ointment - Apply to rash on face and under eyes once to twice daily until improved.  ? ?Ketoconazole 2% cream - May be used to rash/bumps between and under breasts once to twice daily.  ? ?Cryotherapy Aftercare ? ?Wash gently with soap and water everyday.   ?Apply Vaseline and Band-Aid daily until healed.  ? ?Seborrheic Keratosis ? ?What causes seborrheic keratoses? ?Seborrheic keratoses are harmless, common skin growths that first appear during adult life.  As time goes by, more growths appear.  Some people may develop a large number of them.  Seborrheic keratoses appear on both covered and uncovered body parts.  They are not caused by sunlight.  The tendency to develop seborrheic keratoses can be inherited.  They vary in color from skin-colored to gray, brown, or even black.  They can be either smooth or have a rough, warty surface.   ?Seborrheic keratoses are superficial and look as if they were stuck on the skin.  Under the microscope this type of keratosis looks like layers upon layers of skin.  That is why at times the top layer may seem to fall off, but the rest of the growth remains and re-grows.   ? ?Treatment ?Seborrheic keratoses do not need to be treated, but can easily be removed in the office.  Seborrheic keratoses often cause symptoms when they rub on clothing or jewelry.  Lesions can be in the way of shaving.  If they become inflamed, they can cause itching, soreness, or burning.  Removal of a seborrheic keratosis can be accomplished by freezing, burning, or surgery. ?If any spot bleeds, scabs, or grows rapidly, please return to have it checked, as these can be an indication of a skin cancer. ? ? ?If You Need Anything After Your Visit ? ?If you have any questions or concerns for your doctor, please call our main line at [phone] and press option 4 to reach your doctor's medical assistant. If no one answers, please leave a voicemail as directed and we will return your call as soon as  possible. Messages left after 4 pm will be answered the following business day.  ? ?You may also send [place] a message via MyChart. We typically respond to MyChart messages within 1-2 business days. ? ?For prescription refills, please ask your pharmacy to contact our office. Our fax number is [phone]. ? ?If you have an urgent issue when the clinic is closed that cannot wait until the next business day, you can page your doctor at the number below.   ? ?Please note that while we do our best to be available for urgent issues outside of office hours, we are not available 24/7.  ? ?If you have an urgent issue and are unable to reach [place], you may choose to seek medical care at your doctor's office, retail clinic, urgent care center, or emergency room. ? ?If you have a medical emergency, please immediately call 911 or go to the emergency department. ? ?Pager Numbers ? ?- Dr. Nehemiah Massed: [phone] ? ?- Dr. Laurence Ferrari: [phone] ? ?- Dr. Nicole Kindred: [phone] ? ?In the event of inclement weather, please call our main line at [phone] for an update on the status of any delays or closures. ? ?Dermatology Medication Tips: ?Please keep the boxes that topical medications come in in order to help keep track of the instructions about where and how to use these. Pharmacies typically print the medication instructions only on the boxes and not directly on the medication tubes.  ? ?  If your medication is too expensive, please contact our office at [phone] option 4 or send [place] a message through [place].  ? ?We are unable to tell what your co-pay for medications will be in advance as this is different depending on your insurance coverage. However, we may be able to find a substitute medication at lower cost or fill out paperwork to get insurance to cover a needed medication.  ? ?If a prior authorization is required to get your medication covered by your insurance company, please allow [place] 1-2 business days to complete this  process. ? ?Drug prices often vary depending on where the prescription is filled and some pharmacies may offer cheaper prices. ? ?The website www.goodrx.com contains coupons for medications through different pharmacies. The prices here do not account for what the cost may be with help from insurance (it may be cheaper with your insurance), but the website can give you the price if you did not use any insurance.  ?- You can print the associated coupon and take it with your prescription to the pharmacy.  ?- You may also stop by our office during regular business hours and pick up a GoodRx coupon card.  ?- If you need your prescription sent electronically to a different pharmacy, notify our office through [facility] or by phone at [phone] option 4. ? ? ? ? ?Si Usted Necesita Algo Despu?s de Su Visita ? ?Tambi?n puede enviarnos un mensaje a trav?s de MyChart. Por lo general respondemos a los mensajes de MyChart en el transcurso de 1 a 2 d?as h?biles. ? ?Para renovar recetas, por favor pida a su farmacia que se ponga en contacto con nuestra oficina. Nuestro n?mero de fax es el [phone]. ? ?Si tiene un asunto urgente cuando la cl?nica est? cerrada y que no puede esperar hasta el siguiente d?a h?bil, puede llamar/localizar a su doctor(a) al n?mero que aparece a continuaci?n.  ? ?Por favor, tenga en cuenta que aunque hacemos todo lo posible para estar disponibles para asuntos urgentes fuera del horario de oficina, no estamos disponibles las 24 horas del d?a, los 7 d?as de la semana.  ? ?Si tiene un problema urgente y no puede comunicarse con nosotros, puede optar por buscar atenci?n m?dica  en el consultorio de su doctor(a), en una cl?nica privada, en un centro de atenci?n urgente o en una sala de emergencias. ? ?Si tiene Engineer, maintenance (IT) m?dica, por favor llame inmediatamente al 911 o vaya a la sala de emergencias. ? ?N?meros de b?per ? ?- Dr. Nehemiah Massed: [phone] ? ?- Dra. Moye: [phone] ? ?- Dra.  Nicole Kindred: [phone] ? ?En caso de inclemencias del tiempo, por favor llame a nuestra l?nea principal al [phone] para una actualizaci?n sobre el estado de cualquier retraso o cierre. ? ?Consejos para la medicaci?n en dermatolog?a: ?Por favor, guarde las cajas en las que vienen los medicamentos de uso t?pico para ayudarle a seguir las instrucciones sobre d?nde y c?mo usarlos. Las farmacias generalmente imprimen las instrucciones del medicamento s?lo en las cajas y no directamente en los tubos del [place].  ? ?Si su medicamento es muy caro, por favor, p?ngase en contacto con Zigmund Daniel llamando al [phone] y presione la opci?n 4 o env?enos un mensaje a trav?s de MyChart.  ? ?No podemos decirle cu?l ser? su copago por los medicamentos por adelantado ya que esto es diferente dependiendo de la cobertura de su seguro. Sin embargo, es posible que podamos encontrar un medicamento sustituto a Geneticist, molecular  formulario para que el seguro cubra el medicamento que se considera necesario.  ? ?Si se requiere Ardelia Mems autorizaci?n previa para que su compa??a de seguros [place] su medicamento, por favor perm?tanos de 1 a 2 d?as h?biles para completar este proceso. ? ?Los precios de los medicamentos var?an con frecuencia dependiendo del Environmental consultant de d?nde se surte la receta y alguna farmacias pueden ofrecer precios m?s baratos. ? ?El sitio web www.goodrx.com tiene cupones para medicamentos de Airline pilot. Los precios aqu? no tienen en cuenta lo que podr?a costar con la ayuda del seguro (puede ser m?s barato con su seguro), pero el sitio web puede darle el precio si no utiliz? ning?n seguro.  ?- Puede imprimir el cup?n correspondiente y llevarlo con su receta a la farmacia.  ?- Tambi?n puede pasar por nuestra oficina durante el horario de atenci?n regular y recoger una tarjeta de cupones de GoodRx.  ?- Si necesita que su receta se env?e electr?nicamente a Chiropodist, informe a nuestra oficina a  trav?s de MyChart de [place] o por tel?fono llamando al [phone] y presione la opci?n 4. ? ?

## 2022-02-18 ENCOUNTER — Telehealth: Payer: Self-pay

## 2022-02-18 MED ORDER — TACROLIMUS 0.1 % EX OINT: TOPICAL_OINTMENT | Freq: Two times a day (BID) | CUTANEOUS | 0 refills | Status: DC

## 2022-02-18 NOTE — Telephone Encounter (Signed)
Pimcrolimus denied due to no trial of failure of Tacrolimus. Tacrolimus Ointment sent in as replacement. Patient advised. aw ?

## 2022-02-18 NOTE — Telephone Encounter (Signed)
Spoke with patient regarding fillers. She has had these done in the past with Dr. Nehemiah Massed but would really like to start seeing you for this.  ? ?I advised her I would have to get confirmation first to schedule and that you were out of the office but I would be back in touch with her around April 17th or 18th. ? ?Thank you ?

## 2022-02-25 NOTE — Telephone Encounter (Signed)
No office notes of fillers in [place].  ? ?Can schedule patient for a consult with you? ?

## 2022-02-26 ENCOUNTER — Other Ambulatory Visit: Payer: Self-pay | Admitting: Internal Medicine

## 2022-02-26 DIAGNOSIS — Z1231 Encounter for screening mammogram for malignant neoplasm of breast: Secondary | ICD-10-CM

## 2022-03-01 NOTE — Telephone Encounter (Signed)
Dr. Nicole Kindred did OK for me to schedule patient in a 1 hour slot for consult and treatment.  ? ?Patient called and scheduled. Advised her we are $13 per unit and fillers are anywhere from $650-700 per syringe.  ?

## 2022-03-22 ENCOUNTER — Ambulatory Visit (INDEPENDENT_AMBULATORY_CARE_PROVIDER_SITE_OTHER): Payer: Self-pay | Admitting: Dermatology

## 2022-03-22 DIAGNOSIS — L988 Other specified disorders of the skin and subcutaneous tissue: Secondary | ICD-10-CM

## 2022-03-22 NOTE — Patient Instructions (Signed)

## 2022-03-22 NOTE — Progress Notes (Signed)
? ?  Follow-Up Visit ?  ?Subjective  ?Gina Gonzales is a 61 y.o. 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 She had a reaction to her last filler around mouth in 02/2020 (Restylane Refyne), but had Covid four months prior. ? ? ?The following portions of the chart were reviewed this encounter and updated as appropriate:  ?  ?  ? ?Review of Systems:  No other skin or systemic complaints except as noted in HPI or Assessment and Plan. ? ?Objective  ?Well appearing patient in no apparent distress; mood and affect are within normal limits. ? ?A focused examination was performed including face. Relevant physical exam findings are noted in the Assessment and Plan. ? ?face ?Rhytides and volume loss.  ? ? ? ? ? ? ? ? ? ? ? ? ? ? ? ? ? ? ? ? ? ? ? ? ? ? ? ? ? ? ? ? ? ? ? ? ? ? ? ? ? ? ? ? ? ? ? ? ? ? ? ? ?Assessment & Plan  ?Elastosis of skin ?face ? ?Botox 20 units injected today to: ?- Frown complex 20 units ? ? ?Voluma 1cc injected today to: ?- Mid face, 0.5cc per side ? ?Botox Injection - face ?Location: See attached image ? ?Informed consent: Discussed risks (infection, pain, bleeding, bruising, swelling, allergic reaction, paralysis of nearby muscles, eyelid droop, double vision, neck weakness, difficulty breathing, headache, undesirable cosmetic result, and need for additional treatment) and benefits of the procedure, as well as the alternatives.  Informed consent was obtained. ? ?Preparation: The area was cleansed with alcohol. ? ?Procedure Details:  Botox was injected into the dermis with a 30-gauge needle. Pressure applied to any bleeding. Ice packs offered for swelling. ? ?Lot Number:  [id number] ?Expiration:  02/2024 ? ?Total Units Injected:  20 ? ?Plan: Patient was instructed to remain upright for 4 hours. Patient was instructed to avoid massaging the face and avoid vigorous exercise for the rest of the day. Tylenol may be used for headache.  Allow 2 weeks  before returning to clinic for additional dosing as needed. Patient will call for any problems. ? ? ?Filling material injection - face ?Prior to the procedure, the patient's past medical history, allergies and the rare but potential risks and complications were reviewed with the patient and a signed consent was obtained. Pre and post-treatment care was discussed and instructions provided. ? ?Location: cheeks ? ?Filler Type: Juvederm Voluma ? ?Procedure: The area was prepped thoroughly with Puracyn. After introducing the needle into the desired treatment area, the syringe plunger was drawn back to ensure there was no flash of blood prior to injecting the filler in order to minimize risk of intravascular injection and vascular occlusion. After injection of the filler, the treated areas were cleansed and iced to reduce swelling. Post-treatment instructions were reviewed with the patient.      ? ?Patient tolerated the procedure well. The patient will call with any problems, questions or concerns prior to their next appointment. ? ?Lot [id number] exp 12/23/2022 ? ? ?Return for as scheduled. ? ?I, Othelia Pulling, RMA, am acting as scribe for Brendolyn Patty, MD . ? ?Documentation: I have reviewed the above documentation for accuracy and completeness, and I agree with the above. ? ?Brendolyn Patty MD  ? ?

## 2022-04-13 ENCOUNTER — Ambulatory Visit
Admission: RE | Admit: 2022-04-13 | Discharge: 2022-04-13 | Disposition: A | Discharge status: Home / Self Care | Payer: BC Managed Care – PPO | Source: Ambulatory Visit | Attending: Internal Medicine | Admitting: Internal Medicine

## 2022-04-13 DIAGNOSIS — Z1231 Encounter for screening mammogram for malignant neoplasm of breast: Secondary | ICD-10-CM | POA: Diagnosis present

## 2022-07-27 ENCOUNTER — Ambulatory Visit (INDEPENDENT_AMBULATORY_CARE_PROVIDER_SITE_OTHER): Payer: BC Managed Care – PPO | Admitting: Dermatology

## 2022-07-27 DIAGNOSIS — L719 Rosacea, unspecified: Secondary | ICD-10-CM

## 2022-07-27 DIAGNOSIS — Z1283 Encounter for screening for malignant neoplasm of skin: Secondary | ICD-10-CM

## 2022-07-27 DIAGNOSIS — D225 Melanocytic nevi of trunk: Secondary | ICD-10-CM | POA: Diagnosis not present

## 2022-07-27 DIAGNOSIS — L814 Other melanin hyperpigmentation: Secondary | ICD-10-CM

## 2022-07-27 DIAGNOSIS — D235 Other benign neoplasm of skin of trunk: Secondary | ICD-10-CM

## 2022-07-27 DIAGNOSIS — L821 Other seborrheic keratosis: Secondary | ICD-10-CM

## 2022-07-27 DIAGNOSIS — H01134 Eczematous dermatitis of left upper eyelid: Secondary | ICD-10-CM

## 2022-07-27 DIAGNOSIS — H01131 Eczematous dermatitis of right upper eyelid: Secondary | ICD-10-CM

## 2022-07-27 DIAGNOSIS — D229 Melanocytic nevi, unspecified: Secondary | ICD-10-CM

## 2022-07-27 DIAGNOSIS — L578 Other skin changes due to chronic exposure to nonionizing radiation: Secondary | ICD-10-CM

## 2022-07-27 DIAGNOSIS — D2361 Other benign neoplasm of skin of right upper limb, including shoulder: Secondary | ICD-10-CM

## 2022-07-27 MED ORDER — ORACEA 40 MG PO CPDR: DELAYED_RELEASE_CAPSULE | ORAL | 11 refills | Status: DC

## 2022-07-27 MED ORDER — METRONIDAZOLE 0.75 % EX CREA: TOPICAL_CREAM | CUTANEOUS | 5 refills | Status: DC

## 2022-07-27 MED ORDER — SOOLANTRA 1 % EX CREA: TOPICAL_CREAM | CUTANEOUS | 5 refills | Status: DC

## 2022-07-27 NOTE — Progress Notes (Signed)
Follow-Up Visit   Subjective  Gina Gonzales is a 61 y.o. female who presents for the following: Annual Exam.  The patient presents for Total-Body Skin Exam (TBSE) for skin cancer screening and mole check.  The patient has spots, moles and lesions to be evaluated, some may be new or changing. No history of skin cancer. The patient has rosacea and is controlled with Oracea '40mg'$  daily, metronidazole cream, and Soolantra.   The following portions of the chart were reviewed this encounter and updated as appropriate:       Review of Systems:  No other skin or systemic complaints except as noted in HPI or Assessment and Plan.  Objective  Well appearing patient in no apparent distress; mood and affect are within normal limits.  A full examination was performed including scalp, head, eyes, ears, nose, lips, neck, chest, axillae, abdomen, back, buttocks, bilateral upper extremities, bilateral lower extremities, hands, feet, fingers, toes, fingernails, and toenails. All findings within normal limits unless otherwise noted below.  face Mild erythema of the cheeks, nose  spinal lower back 2.4m med dark brown macule   bilateral eyelids Clear today    Assessment & Plan  Skin cancer screening performed today.  Actinic Damage - chronic, secondary to cumulative UV radiation exposure/sun exposure over time - diffuse scaly erythematous macules with underlying dyspigmentation - Recommend daily broad spectrum sunscreen SPF 30+ to sun-exposed areas, reapply every 2 hours as needed.  - Recommend staying in the shade or wearing long sleeves, sun glasses (UVA+UVB protection) and wide brim hats (4-inch brim around the entire circumference of the hat). - Call for new or changing lesions.  Lentigines - Scattered tan macules, R great toe proximal nail fold 1.569mlight brown macule - Due to sun exposure - Benign-appering, observe - Recommend daily broad spectrum sunscreen SPF 30+ to  sun-exposed areas, reapply every 2 hours as needed. - Call for any changes  Seborrheic Keratoses - Stuck-on, waxy, tan-brown papules and/or plaques  - Benign-appearing - Discussed benign etiology and prognosis. - Observe - Call for any changes  Hemangiomas - Red papules - Discussed benign nature - Observe - Call for any changes  Dermatofibroma - Firm pink/brown papulenodule with dimple sign, spinal lower back, right upper arm - Benign appearing - Call for any changes  Melanocytic Nevi - Tan-brown and/or pink-flesh-colored symmetric macules and papules - Benign appearing on exam today - Observation - Call clinic for new or changing moles - Recommend daily use of broad spectrum spf 30+ sunscreen to sun-exposed areas.   Rosacea face  Chronic condition with duration or expected duration over one year. Currently well-controlled.   Rosacea is a chronic progressive skin condition usually affecting the face of adults, causing redness and/or acne bumps. It is treatable but not curable. It sometimes affects the eyes (ocular rosacea) as well. It may respond to topical and/or systemic medication and can flare with stress, sun exposure, alcohol, exercise and some foods.  Daily application of broad spectrum spf 30+ sunscreen to face is recommended to reduce flares.  Continue Soolantra Cream qhs face 5Rf Continue Oracea '40mg'$  1 po QD dsp #30 1 yr Rf. Continue Metronidazole 0.75% Cream qam face 5Rf Continue tretinoin 0.1% and 0.025% cream prn. Patient will call for refills.  Topical retinoid medications like tretinoin/Retin-A, adapalene/Differin, tazarotene/Fabior, and Epiduo/Epiduo Forte can cause dryness and irritation when first started. Only apply a pea-sized amount to the entire affected area. Avoid applying it around the eyes, edges of mouth and creases  at the nose. If you experience irritation, use a good moisturizer first and/or apply the medicine less often. If you are doing well with  the medicine, you can increase how often you use it until you are applying every night. Be careful with sun protection while using this medication as it can make you sensitive to the sun. This medicine should not be used by pregnant women.    Related Medications tretinoin (RETIN-A) 0.1 % cream APPLY TOPICALLY AS DIRECTED AT BEDTIME  tretinoin (RETIN-A) 0.025 % cream Apply a pea-sized amount to face nightly as tolerated.  SOOLANTRA 1 % CREA APPLY TO FACE EVERY EVENING FOR ROSACEA  Nevus spinal lower back  Benign-appearing, stable.  Observation.  Call clinic for new or changing moles.  Recommend daily use of broad spectrum spf 30+ sunscreen to sun-exposed areas.   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 Condition is symptomatic / bothersome to patient. Currently improved.  May continue Desonide qd (husband's) prn for more severe flares. Continue Elidel cream qd/bid prn.  Related Medications pimecrolimus (ELIDEL) 1 % cream Apply to affected areas rash on face and under eyes once to twice daily until improved.   Return in about 1 year (around 07/28/2023) for TBSE.  IJamesetta Orleans, CMA, am acting as scribe for Brendolyn Patty, MD .  Documentation: I have reviewed the above documentation for accuracy and completeness, and I agree with the above.  Brendolyn Patty MD

## 2022-07-27 NOTE — Patient Instructions (Signed)
Due to recent changes in healthcare laws, you may see results of your pathology and/or laboratory studies on MyChart before the doctors have had a chance to review them. We understand that in some cases there may be results that are confusing or concerning to you. Please understand that not all results are received at the same time and often the doctors may need to interpret multiple results in order to provide you with the best plan of care or course of treatment. Therefore, we ask that you please give us 2 business days to thoroughly review all your results before contacting the office for clarification. Should we see a critical lab result, you will be contacted sooner.   If You Need Anything After Your Visit  If you have any questions or concerns for your doctor, please call our main line at [phone] and press option 4 to reach your doctor's medical assistant. If no one answers, please leave a voicemail as directed and we will return your call as soon as possible. Messages left after 4 pm will be answered the following business day.   You may also send us a message via MyChart. We typically respond to MyChart messages within 1-2 business days.  For prescription refills, please ask your pharmacy to contact our office. Our fax number is [phone].  If you have an urgent issue when the clinic is closed that cannot wait until the next business day, you can page your doctor at the number below.    Please note that while we do our best to be available for urgent issues outside of office hours, we are not available 24/7.   If you have an urgent issue and are unable to reach us, you may choose to seek medical care at your doctor's office, retail clinic, urgent care center, or emergency room.  If you have a medical emergency, please immediately call 911 or go to the emergency department.  Pager Numbers  - Dr. Kowalski: [phone]  - Dr. Moye: [phone]  - Dr. Stewart:  [phone]  In the event of inclement weather, please call our main line at [phone] for an update on the status of any delays or closures.  Dermatology Medication Tips: Please keep the boxes that topical medications come in in order to help keep track of the instructions about where and how to use these. Pharmacies typically print the medication instructions only on the boxes and not directly on the medication tubes.   If your medication is too expensive, please contact our office at [phone] option 4 or send us a message through MyChart.   We are unable to tell what your co-pay for medications will be in advance as this is different depending on your insurance coverage. However, we may be able to find a substitute medication at lower cost or fill out paperwork to get insurance to cover a needed medication.   If a prior authorization is required to get your medication covered by your insurance company, please allow us 1-2 business days to complete this process.  Drug prices often vary depending on where the prescription is filled and some pharmacies may offer cheaper prices.  The website www.goodrx.com contains coupons for medications through different pharmacies. The prices here do not account for what the cost may be with help from insurance (it may be cheaper with your insurance), but the website can give you the price if you did not use any insurance.  - You can print the associated coupon and take it with   your prescription to the pharmacy.  - You may also stop by our office during regular business hours and pick up a GoodRx coupon card.  - If you need your prescription sent electronically to a different pharmacy, notify our office through [facility] MyChart or by phone at [phone] option 4.     Si Usted Necesita Algo Despus de Su Visita  Tambin puede enviarnos un mensaje a travs de MyChart. Por lo general respondemos a los mensajes de MyChart en el transcurso de 1 a 2  das hbiles.  Para renovar recetas, por favor pida a su farmacia que se ponga en contacto con nuestra oficina. Nuestro nmero de fax es el [phone].  Si tiene un asunto urgente cuando la clnica est cerrada y que no puede esperar hasta el siguiente da hbil, puede llamar/localizar a su doctor(a) al nmero que aparece a continuacin.   Por favor, tenga en cuenta que aunque hacemos todo lo posible para estar disponibles para asuntos urgentes fuera del horario de oficina, no estamos disponibles las 24 horas del da, los 7 das de la semana.   Si tiene un problema urgente y no puede comunicarse con nosotros, puede optar por buscar atencin mdica  en el consultorio de su doctor(a), en una clnica privada, en un centro de atencin urgente o en una sala de emergencias.  Si tiene una emergencia mdica, por favor llame inmediatamente al 911 o vaya a la sala de emergencias.  Nmeros de bper  - Dr. Kowalski: [phone]  - Dra. Moye: [phone]  - Dra. Stewart: [phone]  En caso de inclemencias del tiempo, por favor llame a nuestra lnea principal al [phone] para una actualizacin sobre el estado de cualquier retraso o cierre.  Consejos para la medicacin en dermatologa: Por favor, guarde las cajas en las que vienen los medicamentos de uso tpico para ayudarle a seguir las instrucciones sobre dnde y cmo usarlos. Las farmacias generalmente imprimen las instrucciones del medicamento slo en las cajas y no directamente en los tubos del medicamento.   Si su medicamento es muy caro, por favor, pngase en contacto con nuestra oficina llamando al [phone] y presione la opcin 4 o envenos un mensaje a travs de MyChart.   No podemos decirle cul ser su copago por los medicamentos por adelantado ya que esto es diferente dependiendo de la cobertura de su seguro. Sin embargo, es posible que podamos encontrar un medicamento sustituto a menor costo o llenar un formulario para que el  seguro cubra el medicamento que se considera necesario.   Si se requiere una autorizacin previa para que su compaa de seguros cubra su medicamento, por favor permtanos de 1 a 2 das hbiles para completar este proceso.  Los precios de los medicamentos varan con frecuencia dependiendo del lugar de dnde se surte la receta y alguna farmacias pueden ofrecer precios ms baratos.  El sitio web www.goodrx.com tiene cupones para medicamentos de diferentes farmacias. Los precios aqu no tienen en cuenta lo que podra costar con la ayuda del seguro (puede ser ms barato con su seguro), pero el sitio web puede darle el precio si no utiliz ningn seguro.  - Puede imprimir el cupn correspondiente y llevarlo con su receta a la farmacia.  - Tambin puede pasar por nuestra oficina durante el horario de atencin regular y recoger una tarjeta de cupones de GoodRx.  - Si necesita que su receta se enve electrnicamente a una farmacia diferente, informe a nuestra oficina a travs de MyChart de [facility]   o por telfono llamando al [phone] y presione la opcin 4.  

## 2022-08-12 ENCOUNTER — Other Ambulatory Visit: Payer: Self-pay | Admitting: Dermatology

## 2022-08-12 DIAGNOSIS — L719 Rosacea, unspecified: Secondary | ICD-10-CM

## 2023-03-07 ENCOUNTER — Other Ambulatory Visit: Payer: Self-pay | Admitting: Internal Medicine

## 2023-03-07 DIAGNOSIS — Z1231 Encounter for screening mammogram for malignant neoplasm of breast: Secondary | ICD-10-CM

## 2023-04-21 ENCOUNTER — Ambulatory Visit
Admission: RE | Admit: 2023-04-21 | Discharge: 2023-04-21 | Disposition: A | Discharge status: Home / Self Care | Payer: 59 | Source: Ambulatory Visit | Attending: Internal Medicine | Admitting: Internal Medicine

## 2023-04-21 DIAGNOSIS — Z1231 Encounter for screening mammogram for malignant neoplasm of breast: Secondary | ICD-10-CM | POA: Insufficient documentation

## 2023-05-30 ENCOUNTER — Other Ambulatory Visit: Payer: Self-pay

## 2023-05-30 DIAGNOSIS — L719 Rosacea, unspecified: Secondary | ICD-10-CM

## 2023-05-30 MED ORDER — METRONIDAZOLE 0.75 % EX CREA: TOPICAL_CREAM | CUTANEOUS | 2 refills | Status: DC

## 2023-05-30 MED ORDER — SOOLANTRA 1 % EX CREA: TOPICAL_CREAM | CUTANEOUS | 2 refills | Status: DC

## 2023-05-30 NOTE — Telephone Encounter (Signed)
Patient called requesting a refill of Soolantra cream and Metrocream,  Sent to  [facility]

## 2023-06-08 ENCOUNTER — Encounter: Payer: Self-pay | Admitting: Internal Medicine

## 2023-06-15 ENCOUNTER — Encounter
Admission: RE | Disposition: A | Discharge status: Home / Self Care | Payer: Self-pay | Source: Home / Self Care | Attending: Internal Medicine

## 2023-06-15 ENCOUNTER — Ambulatory Visit: Payer: No Typology Code available for payment source | Admitting: Anesthesiology

## 2023-06-15 ENCOUNTER — Encounter: Payer: Self-pay | Admitting: Internal Medicine

## 2023-06-15 ENCOUNTER — Ambulatory Visit
Admission: RE | Admit: 2023-06-15 | Discharge: 2023-06-15 | Disposition: A | Discharge status: Home / Self Care | Payer: No Typology Code available for payment source | Attending: Internal Medicine | Admitting: Internal Medicine

## 2023-06-15 ENCOUNTER — Other Ambulatory Visit: Payer: Self-pay | Admitting: Internal Medicine

## 2023-06-15 DIAGNOSIS — D123 Benign neoplasm of transverse colon: Secondary | ICD-10-CM | POA: Diagnosis not present

## 2023-06-15 DIAGNOSIS — K640 First degree hemorrhoids: Secondary | ICD-10-CM | POA: Diagnosis not present

## 2023-06-15 DIAGNOSIS — Z1211 Encounter for screening for malignant neoplasm of colon: Secondary | ICD-10-CM | POA: Diagnosis not present

## 2023-06-15 DIAGNOSIS — K5909 Other constipation: Secondary | ICD-10-CM | POA: Insufficient documentation

## 2023-06-15 DIAGNOSIS — K635 Polyp of colon: Secondary | ICD-10-CM | POA: Insufficient documentation

## 2023-06-15 HISTORY — PX: POLYPECTOMY: SHX5525

## 2023-06-15 HISTORY — PX: COLONOSCOPY: SHX5424

## 2023-06-15 SURGERY — COLONOSCOPY WITH PROPOFOL
Anesthesia: General

## 2023-06-15 SURGERY — COLONOSCOPY
Anesthesia: General

## 2023-06-15 MED ORDER — DEXMEDETOMIDINE HCL IN NACL 200 MCG/50ML IV SOLN
INTRAVENOUS | Status: DC | PRN
  Administered 2023-06-15: 12 ug via INTRAVENOUS

## 2023-06-15 MED ORDER — SODIUM CHLORIDE 0.9 % IV SOLN
INTRAVENOUS | Status: DC
Start: 2023-06-15 — End: 2023-06-15
  Administered 2023-06-15: 20 mL/h via INTRAVENOUS

## 2023-06-15 MED ORDER — PROPOFOL 10 MG/ML IV BOLUS
INTRAVENOUS | Status: DC | PRN
  Administered 2023-06-15: 60 mg via INTRAVENOUS

## 2023-06-15 MED ORDER — LIDOCAINE HCL (CARDIAC) PF 100 MG/5ML IV SOSY
PREFILLED_SYRINGE | INTRAVENOUS | Status: DC | PRN
  Administered 2023-06-15: 100 mg via INTRAVENOUS

## 2023-06-15 MED ORDER — PROPOFOL 500 MG/50ML IV EMUL
INTRAVENOUS | Status: DC | PRN
  Administered 2023-06-15: 165 ug/kg/min via INTRAVENOUS

## 2023-06-15 NOTE — Anesthesia Preprocedure Evaluation (Signed)
Anesthesia Evaluation  Patient identified by MRN, date of birth, ID band Patient awake    Reviewed: Allergy & Precautions, H&P , NPO status , Patient's Chart, lab work & pertinent test results, reviewed documented beta blocker date and time   History of Anesthesia Complications Negative for: history of anesthetic complications  Airway Mallampati: I  TM Distance: >3 FB Neck ROM: full    Dental  (+) Dental Advidsory Given, Caps, Teeth Intact   Pulmonary neg pulmonary ROS, Continuous Positive Airway Pressure Ventilation    Pulmonary exam normal breath sounds clear to auscultation       Cardiovascular Exercise Tolerance: Good negative cardio ROS Normal cardiovascular exam Rhythm:regular Rate:Normal     Neuro/Psych negative neurological ROS  negative psych ROS   GI/Hepatic negative GI ROS, Neg liver ROS,,,  Endo/Other  negative endocrine ROS    Renal/GU negative Renal ROS  negative genitourinary   Musculoskeletal   Abdominal   Peds  Hematology negative hematology ROS (+)   Anesthesia Other Findings Past Medical History: No date: Atypical chest pain No date: Exercise-induced asthma No date: Fatigue     Comment:  hx No date: Perimenopause No date: Rosacea No date: SVT (supraventricular tachycardia) No date: Tachycardia   Reproductive/Obstetrics negative OB ROS                             Anesthesia Physical Anesthesia Plan  ASA: 1  Anesthesia Plan: General   Post-op Pain Management:    Induction: Intravenous  PONV Risk Score and Plan: 3 and Propofol infusion and TIVA  Airway Management Planned: Natural Airway and Nasal Cannula  Additional Equipment:   Intra-op Plan:   Post-operative Plan:   Informed Consent: I have reviewed the patients History and Physical, chart, labs and discussed the procedure including the risks, benefits and alternatives for the proposed anesthesia  with the patient or authorized representative who has indicated his/her understanding and acceptance.     Dental Advisory Given  Plan Discussed with: Anesthesiologist, CRNA and Surgeon  Anesthesia Plan Comments:         Anesthesia Quick Evaluation

## 2023-06-15 NOTE — Interval H&P Note (Signed)
History and Physical Interval Note:  06/15/2023 9:38 AM  Gina Gonzales  has presented today for surgery, with the diagnosis of V76.51 (ICD-9-CM) - Z12.11 (ICD-10-CM) - Colon cancer screening.  The various methods of treatment have been discussed with the patient and family. After consideration of risks, benefits and other options for treatment, the patient has consented to  Procedure(s): COLONOSCOPY (N/A) as a surgical intervention.  The patient's history has been reviewed, patient examined, no change in status, stable for surgery.  I have reviewed the patient's chart and labs.  Questions were answered to the patient's satisfaction.     [place], [place]

## 2023-06-15 NOTE — H&P (Signed)
Outpatient short stay form Pre-procedure 06/15/2023 9:36 AM Gina Gonzales K. Norma Fredrickson, M.D.  Primary Physician: Daniel Nones III, M.D.  Reason for visit:  Colon cancer screening  History of present illness:  61 y/o patient female with history of mild chronic constipation presents for screening colonoscopy. Denies abdominal pain, hematochezia, involuntary weight loss. Patient denies intractable heartburn, dysphagia, hemetemesis, abdominal pain, nausea or vomiting.     Current Facility-Administered Medications:    0.9 %  sodium chloride infusion, , Intravenous, Continuous, Shakir Petrosino, Boykin Nearing, MD  Medications Prior to Admission  Medication Sig Dispense Refill Last Dose   Calcium Carbonate-Vitamin D (CALTRATE 600+D) 600-400 MG-UNIT per tablet Take 1 tablet by mouth 2 (two) times daily.     Past Week   cetirizine (ZYRTEC) 10 MG tablet Take by mouth.   06/14/2023   fluticasone (FLONASE) 50 MCG/ACT nasal spray Place into the nose.   06/15/2023 at 0700   hydrocortisone 2.5 % cream Apply 1-2 times a day to affected areas face as directed. 30 g 3 Past Week   ketoconazole (NIZORAL) 2 % cream Apply 1-2 times a day to affected areas face as directed. 60 g 3 Past Week   magnesium 30 MG tablet Take 30 mg by mouth 2 (two) times daily.   Past Week   metroNIDAZOLE (METROCREAM) 0.75 % cream APPLY A SMALL AMOUNT TO AFFECTED AREA EVERY MORNING FOR ROSACEA 45 g 2 Past Week   Multiple Vitamins-Minerals (ONE-A-DAY EXTRAS ANTIOXIDANT) CAPS Take 1 capsule by mouth daily.     Past Week   ORACEA 40 MG capsule TAKE 1 CAPSULE BY MOUTH EVERY DAY 30 capsule 11 Past Week   pimecrolimus (ELIDEL) 1 % cream Apply to affected areas rash on face and under eyes once to twice daily until improved. 30 g 3 Past Week   SOOLANTRA 1 % CREA APPLY TO FACE EVERY EVENING FOR ROSACEA 45 g 2 Past Week   tacrolimus (PROTOPIC) 0.1 % ointment Apply topically 2 (two) times daily. To rash under eyes until improved 30 g 0 Past Week   tretinoin (RETIN-A)  0.05 % cream APPLY TOPICALLY AT BEDTIME AS DIRECTED 45 g 3 Past Week   tretinoin (RETIN-A) 0.1 % cream APPLY TOPICALLY AS DIRECTED AT BEDTIME 45 g 6 Past Week   chlorpheniramine (ALLERGY RELIEF) 4 MG tablet Take 4 mg by mouth daily. In fall  (Patient not taking: Reported on 05/20/2020)      tretinoin (RETIN-A) 0.025 % cream Apply a pea-sized amount to face nightly as tolerated. 45 g 6      Allergies  Allergen Reactions   Naproxen    Penicillins      Past Medical History:  Diagnosis Date   Atypical chest pain    Exercise-induced asthma    Fatigue    hx   Perimenopause    Rosacea    SVT (supraventricular tachycardia)    Tachycardia     Review of systems:  Otherwise negative.    Physical Exam  Gen: Alert, oriented. Appears stated age.  HEENT: [place]/AT. PERRLA. Lungs: CTA, no wheezes. CV: RR nl S1, S2. Abd: soft, benign, no masses. BS+ Ext: No edema. Pulses 2+    Planned procedures: Proceed with colonoscopy. The patient understands the nature of the planned procedure, indications, risks, alternatives and potential complications including but not limited to bleeding, infection, perforation, damage to internal organs and possible oversedation/side effects from anesthesia. The patient agrees and gives consent to proceed.  Please refer to procedure notes for findings, recommendations and  patient disposition/instructions.     Bertie Simien K. Norma Fredrickson, M.D. Gastroenterology 06/15/2023  9:36 AM

## 2023-06-15 NOTE — Transfer of Care (Signed)
Immediate Anesthesia Transfer of Care Note  Patient: Gina Gonzales  Procedure(s) Performed: COLONOSCOPY POLYPECTOMY  Patient Location: Endoscopy Unit  Anesthesia Type:General  Level of Consciousness: drowsy and patient cooperative  Airway & Oxygen Therapy: Patient Spontanous Breathing and Patient connected to face mask oxygen  Post-op Assessment: Report given to RN and Post -op Vital signs reviewed and stable  Post vital signs: Reviewed and stable  Last Vitals:  Vitals Value Taken Time  BP 82/52 06/15/23 1024  Temp 36.2 C 06/15/23 1020  Pulse 45 06/15/23 1028  Resp 10 06/15/23 1028  SpO2 100 % 06/15/23 1028  Vitals shown include unfiled device data.  Last Pain:  Vitals:   06/15/23 1020  TempSrc: Temporal  PainSc: Asleep         Complications: No notable events documented.

## 2023-06-15 NOTE — Anesthesia Procedure Notes (Signed)
Procedure Name: General with mask airway Date/Time: 06/15/2023 9:54 AM  Performed by: Mohammed Kindle, CRNAPre-anesthesia Checklist: Patient identified, Emergency Drugs available, Suction available and Patient being monitored Patient Re-evaluated:Patient Re-evaluated prior to induction Oxygen Delivery Method: Simple face mask Induction Type: IV induction Placement Confirmation: positive ETCO2, CO2 detector and breath sounds checked- equal and bilateral Dental Injury: Teeth and Oropharynx as per pre-operative assessment

## 2023-06-15 NOTE — Op Note (Addendum)
[facility] Gastroenterology Patient Name: Gina Gonzales Procedure Date: 06/15/2023 9:45 AM MRN: [id number] Account #: [id number] Date of Birth: [birth year] Admit Type: Outpatient Age: 62 Room: [facility] ENDO ROOM 2 Gender: Female Note Status: Supervisor Override Instrument Name: Prentice Docker [id number] Procedure:             Colonoscopy Indications:           Screening for colorectal malignant neoplasm Providers:             Royce Macadamia K. Norma Fredrickson MD, MD Referring MD:          Daniel Nones, MD (Referring MD) Medicines:             Propofol per Anesthesia Complications:         No immediate complications. Procedure:             Pre-Anesthesia Assessment:                        - The risks and benefits of the procedure and the                         sedation options and risks were discussed with the                         patient. All questions were answered and informed                         consent was obtained.                        - Patient identification and proposed procedure were                         verified prior to the procedure by the nurse. The                         procedure was verified in the procedure room.                        - ASA Grade Assessment: III - A patient with severe                         systemic disease.                        - After reviewing the risks and benefits, the patient                         was deemed in satisfactory condition to undergo the                         procedure.                        After obtaining informed consent, the colonoscope was                         passed under direct vision. Throughout the procedure,  the patient's blood pressure, pulse, and oxygen                         saturations were monitored continuously. The                         Colonoscope was introduced through the anus and                         advanced to the the cecum, identified by appendiceal                          orifice and ileocecal valve. The colonoscopy was                         performed without difficulty. The patient tolerated                         the procedure well. The quality of the bowel                         preparation was good. The ileocecal valve, appendiceal                         orifice, and rectum were photographed. Findings:      The perianal and digital rectal examinations were normal. Pertinent       negatives include normal sphincter tone and no palpable rectal lesions.      Non-bleeding internal hemorrhoids were found during retroflexion. The       hemorrhoids were Grade I (internal hemorrhoids that do not prolapse).      A 4 mm polyp was found in the distal sigmoid colon. The polyp was       sessile. The polyp was removed with a jumbo cold forceps. Resection and       retrieval were complete.      A 9 mm polyp was found in the transverse colon. The polyp was sessile.       The polyp was removed with a cold snare. Resection and retrieval were       complete.      The exam was otherwise without abnormality. Impression:            - Non-bleeding internal hemorrhoids.                        - One 4 mm polyp in the distal sigmoid colon, removed                         with a jumbo cold forceps. Resected and retrieved.                        - One 9 mm polyp in the transverse colon, removed with                         a cold snare. Resected and retrieved.                        - The examination was otherwise normal. Recommendation:        - Patient has  a contact number available for                         emergencies. The signs and symptoms of potential                         delayed complications were discussed with the patient.                         Return to normal activities tomorrow. Written                         discharge instructions were provided to the patient.                        - Resume previous diet.                        -  Continue present medications.                        - Repeat colonoscopy is recommended for surveillance.                         The colonoscopy date will be determined after                         pathology results from today's exam become available                         for review.                        - Return to GI office PRN.                        - The findings and recommendations were discussed with                         the patient. Procedure Code(s):     --- Professional ---                        [phone], Colonoscopy, flexible; with removal of                         tumor(s), polyp(s), or other lesion(s) by snare                         technique                        45380, 59, Colonoscopy, flexible; with biopsy, single                         or multiple Diagnosis Code(s):     --- Professional ---                        K64.0, First degree hemorrhoids                        D12.3, Benign neoplasm of  transverse colon (hepatic                         flexure or splenic flexure)                        D12.5, Benign neoplasm of sigmoid colon                        Z86.010, Personal history of colonic polyps CPT copyright 2022 American Medical Association. All rights reserved. The codes documented in this report are preliminary and upon coder review may  be revised to meet current compliance requirements. Stanton Kidney MD, MD 06/15/2023 10:20:29 AM This report has been signed electronically. Number of Addenda: 0 Note Initiated On: 06/15/2023 9:45 AM Scope Withdrawal Time: 0 hours 5 minutes 34 seconds  Total Procedure Duration: 0 hours 12 minutes 45 seconds  Estimated Blood Loss:  Estimated blood loss: none.      [facility]

## 2023-06-16 ENCOUNTER — Encounter: Payer: Self-pay | Admitting: Internal Medicine

## 2023-06-16 NOTE — Anesthesia Postprocedure Evaluation (Signed)
Anesthesia Post Note  Patient: Gina Gonzales  Procedure(s) Performed: COLONOSCOPY POLYPECTOMY  Patient location during evaluation: Endoscopy Anesthesia Type: General Level of consciousness: awake and alert Pain management: pain level controlled Vital Signs Assessment: post-procedure vital signs reviewed and stable Respiratory status: spontaneous breathing, nonlabored ventilation, respiratory function stable and patient connected to nasal cannula oxygen Cardiovascular status: blood pressure returned to baseline and stable Postop Assessment: no apparent nausea or vomiting Anesthetic complications: no   No notable events documented.   Last Vitals:  Vitals:   06/15/23 1044 06/15/23 1054  BP: 90/66 97/62  Pulse: (!) 56 (!) 45  Resp: 16 14  Temp:    SpO2: 100% 100%    Last Pain:  Vitals:   06/16/23 0736  TempSrc:   PainSc: 0-No pain                 Lenard Simmer

## 2023-06-20 ENCOUNTER — Other Ambulatory Visit: Payer: Self-pay

## 2023-06-20 DIAGNOSIS — L719 Rosacea, unspecified: Secondary | ICD-10-CM

## 2023-06-20 MED ORDER — TRETINOIN 0.1 % EX CREA: TOPICAL_CREAM | Freq: Every day | CUTANEOUS | 1 refills | Status: DC

## 2023-06-20 NOTE — Progress Notes (Signed)
Change of pharmacy request per patient. aw

## 2023-08-08 ENCOUNTER — Ambulatory Visit: Payer: No Typology Code available for payment source | Admitting: Dermatology

## 2023-08-08 VITALS — BP 122/66

## 2023-08-08 DIAGNOSIS — L814 Other melanin hyperpigmentation: Secondary | ICD-10-CM

## 2023-08-08 DIAGNOSIS — L719 Rosacea, unspecified: Secondary | ICD-10-CM

## 2023-08-08 DIAGNOSIS — Z7189 Other specified counseling: Secondary | ICD-10-CM

## 2023-08-08 DIAGNOSIS — Z1283 Encounter for screening for malignant neoplasm of skin: Secondary | ICD-10-CM

## 2023-08-08 DIAGNOSIS — D1801 Hemangioma of skin and subcutaneous tissue: Secondary | ICD-10-CM

## 2023-08-08 DIAGNOSIS — W908XXA Exposure to other nonionizing radiation, initial encounter: Secondary | ICD-10-CM

## 2023-08-08 DIAGNOSIS — L578 Other skin changes due to chronic exposure to nonionizing radiation: Secondary | ICD-10-CM

## 2023-08-08 DIAGNOSIS — D2272 Melanocytic nevi of left lower limb, including hip: Secondary | ICD-10-CM

## 2023-08-08 DIAGNOSIS — L821 Other seborrheic keratosis: Secondary | ICD-10-CM

## 2023-08-08 DIAGNOSIS — D239 Other benign neoplasm of skin, unspecified: Secondary | ICD-10-CM

## 2023-08-08 DIAGNOSIS — D2361 Other benign neoplasm of skin of right upper limb, including shoulder: Secondary | ICD-10-CM

## 2023-08-08 DIAGNOSIS — D229 Melanocytic nevi, unspecified: Secondary | ICD-10-CM

## 2023-08-08 DIAGNOSIS — D235 Other benign neoplasm of skin of trunk: Secondary | ICD-10-CM | POA: Diagnosis not present

## 2023-08-08 DIAGNOSIS — D225 Melanocytic nevi of trunk: Secondary | ICD-10-CM

## 2023-08-08 MED ORDER — METRONIDAZOLE 0.75 % EX CREA: TOPICAL_CREAM | Freq: Every morning | CUTANEOUS | 11 refills | Status: AC

## 2023-08-08 MED ORDER — TRETINOIN 0.1 % EX CREA
TOPICAL_CREAM | Freq: Every day | CUTANEOUS | 11 refills | Status: AC
Start: 2023-08-08 — End: 2024-08-07

## 2023-08-08 MED ORDER — IVERMECTIN 1 % EX CREA: 1.0000 | TOPICAL_CREAM | Freq: Every day | CUTANEOUS | 11 refills | Status: DC

## 2023-08-08 MED ORDER — ORACEA 40 MG PO CPDR: 1.0000 | DELAYED_RELEASE_CAPSULE | Freq: Every day | ORAL | 11 refills | Status: DC

## 2023-08-08 NOTE — Patient Instructions (Addendum)
Counseling for BBL / IPL / Laser and Coordination of Care Discussed the treatment option of Broad Band Light (BBL) /Intense Pulsed Light (IPL)/ Laser for skin discoloration, including brown spots and redness.  Typically we recommend at least 1-3 treatment sessions about 5-8 weeks apart for best results.  Cannot have tanned skin when BBL performed, and regular use of sunscreen/photoprotection is advised after the procedure to help maintain results. The patient's condition may also require "maintenance treatments" in the future.  The fee for BBL / laser treatments is $350 per treatment session for the whole face.  A fee can be quoted for other parts of the body.  Insurance typically does not pay for BBL/laser treatments and therefore the fee is an out-of-pocket cost. Recommend prophylactic valtrex treatment. Once scheduled for procedure, will send Rx in prior to patient's appointment.     Due to recent changes in healthcare laws, you may see results of your pathology and/or laboratory studies on MyChart before the doctors have had a chance to review them. We understand that in some cases there may be results that are confusing or concerning to you. Please understand that not all results are received at the same time and often the doctors may need to interpret multiple results in order to provide you with the best plan of care or course of treatment. Therefore, we ask that you please give [place] 2 business days to thoroughly review all your results before contacting the office for clarification. Should we see a critical lab result, you will be contacted sooner.   If You Need Anything After Your Visit  If you have any questions or concerns for your doctor, please call our main line at [phone] and press option 4 to reach your doctor's medical assistant. If no one answers, please leave a voicemail as directed and we will return your call as soon as possible. Messages left after 4 pm will be answered the  following business day.   You may also send [place] a message via MyChart. We typically respond to MyChart messages within 1-2 business days.  For prescription refills, please ask your pharmacy to contact our office. Our fax number is [phone].  If you have an urgent issue when the clinic is closed that cannot wait until the next business day, you can page your doctor at the number below.    Please note that while we do our best to be available for urgent issues outside of office hours, we are not available 24/7.   If you have an urgent issue and are unable to reach [place], you may choose to seek medical care at your doctor's office, retail clinic, urgent care center, or emergency room.  If you have a medical emergency, please immediately call 911 or go to the emergency department.  Pager Numbers  - Dr. Gwen Pounds: [phone]  - Dr. Roseanne Reno: [phone]  - Dr. Katrinka Blazing: [phone]   In the event of inclement weather, please call our main line at [phone] for an update on the status of any delays or closures.  Dermatology Medication Tips: Please keep the boxes that topical medications come in in order to help keep track of the instructions about where and how to use these. Pharmacies typically print the medication instructions only on the boxes and not directly on the medication tubes.   If your medication is too expensive, please contact our office at [phone] option 4 or send [place] a message through MyChart.   We are unable to tell what  your co-pay for medications will be in advance as this is different depending on your insurance coverage. However, we may be able to find a substitute medication at lower cost or fill out paperwork to get insurance to cover a needed medication.   If a prior authorization is required to get your medication covered by your insurance company, please allow [place] 1-2 business days to complete this process.  Drug prices often vary depending on where the  prescription is filled and some pharmacies may offer cheaper prices.  The website www.goodrx.com contains coupons for medications through different pharmacies. The prices here do not account for what the cost may be with help from insurance (it may be cheaper with your insurance), but the website can give you the price if you did not use any insurance.  - You can print the associated coupon and take it with your prescription to the pharmacy.  - You may also stop by our office during regular business hours and pick up a GoodRx coupon card.  - If you need your prescription sent electronically to a different pharmacy, notify our office through [facility] or by phone at [phone] option 4.     Si Usted Necesita Algo Despus de Su Visita  Tambin puede enviarnos un mensaje a travs de Clinical cytogeneticist. Por lo general respondemos a los mensajes de MyChart en el transcurso de 1 a 2 das hbiles.  Para renovar recetas, por favor pida a su farmacia que se ponga en contacto con nuestra oficina. Annie Sable de fax es [place] [phone].  Si tiene un asunto urgente cuando la clnica est cerrada y que no puede esperar hasta el siguiente da hbil, puede llamar/localizar a su doctor(a) al nmero que aparece a continuacin.   Por favor, tenga en cuenta que aunque hacemos todo lo posible para estar disponibles para asuntos urgentes fuera del horario de [place], no estamos disponibles las 24 horas del da, los 7 [address]  [address] de la [place].   Si tiene un problema urgente y no puede comunicarse con nosotros, puede optar por buscar atencin mdica  en el consultorio de su doctor(a), en una clnica privada, en un centro de atencin urgente o en una sala de emergencias.  Si tiene Engineer, drilling, por favor llame inmediatamente al 911 o vaya a la sala de emergencias.  Nmeros de bper  - Dr. Gwen Pounds: [phone]  - Dra. Roseanne Reno: [phone]  - Dr. Katrinka Blazing: [phone]   En caso de inclemencias del  tiempo, por favor llame a Lacy Duverney principal al [phone] para una actualizacin sobre el [place] de cualquier retraso o cierre.  Consejos para la medicacin en dermatologa: Por favor, guarde las cajas en las que vienen los medicamentos de uso tpico para ayudarle a seguir las instrucciones sobre dnde y cmo usarlos. Las farmacias generalmente imprimen las instrucciones del medicamento slo en las cajas y no directamente en los tubos del [place].   Si su medicamento es muy caro, por favor, pngase en contacto con Rolm Gala llamando al [phone] y presione la opcin 4 o envenos un mensaje a travs de Clinical cytogeneticist.   No podemos decirle cul ser su copago por los medicamentos por adelantado ya que esto es diferente dependiendo de la cobertura de su seguro. Sin embargo, es posible que podamos encontrar un medicamento sustituto a Audiological scientist un formulario para que el seguro cubra el medicamento que se considera necesario.   Si se requiere una autorizacin previa para que su compaa de seguros [place] su  medicamento, por favor permtanos de 1 a 2 das hbiles para completar [address]  Los precios de los medicamentos varan con frecuencia dependiendo del Environmental consultant de dnde se surte la receta y alguna farmacias pueden ofrecer precios ms baratos.  El sitio web www.goodrx.com tiene cupones para medicamentos de Health and safety inspector. Los precios aqu no tienen en cuenta lo que podra costar con la ayuda del seguro (puede ser ms barato con su seguro), pero el sitio web puede darle el precio si no utiliz Tourist information centre manager.  - Puede imprimir el cupn correspondiente y llevarlo con su receta a la farmacia.  - Tambin puede pasar por nuestra oficina durante el horario de atencin regular y Education officer, museum una tarjeta de cupones de GoodRx.  - Si necesita que su receta se enve electrnicamente a una farmacia diferente, informe a nuestra oficina a travs de MyChart de [place] o por telfono  llamando al [phone] y presione la opcin 4.

## 2023-08-08 NOTE — Progress Notes (Signed)
Follow-Up Visit   Subjective  Gina Gonzales is a 62 y.o. female who presents for the following: Skin Cancer Screening and Full Body Skin Exam, Rosacea, face, Soolantra cr qhs, Oracea 40mg  1 po qd, Metronidazole 0.75% cr qam, Tretinoin 0.1% cr every day.  Rosacea is well controlled with treatment.  She has brown spots on face that don't clear.  The patient presents for Total-Body Skin Exam (TBSE) for skin cancer screening and mole check. The patient has spots, moles and lesions to be evaluated, some may be new or changing and the patient may have concern these could be cancer.    The following portions of the chart were reviewed this encounter and updated as appropriate: medications, allergies, medical history  Review of Systems:  No other skin or systemic complaints except as noted in HPI or Assessment and Plan.  Objective  Well appearing patient in no apparent distress; mood and affect are within normal limits.  A full examination was performed including scalp, head, eyes, ears, nose, lips, neck, chest, axillae, abdomen, back, buttocks, bilateral upper extremities, bilateral lower extremities, hands, feet, fingers, toes, fingernails, and toenails. All findings within normal limits unless otherwise noted below.   Relevant physical exam findings are noted in the Assessment and Plan.    Assessment & Plan   SKIN CANCER SCREENING PERFORMED TODAY.  ACTINIC DAMAGE - Chronic condition, secondary to cumulative UV/sun exposure - diffuse scaly erythematous macules with underlying dyspigmentation - Recommend daily broad spectrum sunscreen SPF 30+ to sun-exposed areas, reapply every 2 hours as needed.  - Staying in the shade or wearing long sleeves, sun glasses (UVA+UVB protection) and wide brim hats (4-inch brim around the entire circumference of the hat) are also recommended for sun protection.  - Call for new or changing lesions.  LENTIGINES, SEBORRHEIC KERATOSES, HEMANGIOMAS -  Benign normal skin lesions - Benign-appearing - Call for any changes  LENTIGINES Exam: scattered tan macules face Due to sun exposure Treatment Plan: Benign-appearing, observe. Recommend daily broad spectrum sunscreen SPF 30+ to sun-exposed areas, reapply every 2 hours as needed.  Call for any changes  Recommend BBL treatment- see below.  Pt will schedule.  MELANOCYTIC NEVI - Tan-brown and/or pink-flesh-colored symmetric macules and papules - R spinal lower back 2.84mm med dark brown macule, stable from previous visit - L ant thigh 4.61mm brown macule - Benign appearing on exam today - Observation - Call clinic for new or changing moles - Recommend daily use of broad spectrum spf 30+ sunscreen to sun-exposed areas.   LENTIGO R great toe proximal nail fold Exam: 1.14mm light brown macule  Treatment Plan: Benign-appearing. Stable compared to previous visit. Observation.  Call clinic for new or changing moles.  Recommend daily use of broad spectrum spf 30+ sunscreen to sun-exposed areas.    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 Condition is improving with treatment but not currently at goal.   Rosacea is a chronic progressive skin condition usually affecting the face of adults, causing redness and/or acne bumps. It is treatable but not curable. It sometimes affects the eyes (ocular rosacea) as well. It may respond to topical and/or systemic medication and can flare with stress, sun exposure, alcohol, exercise, topical steroids (including hydrocortisone/cortisone 10) and some foods.  Daily application of broad spectrum spf 30+ sunscreen to face is recommended to reduce flares.  Patient denies grittiness of the eyes  Treatment  Plan Cont Soolantra cr qhs to face Cont Oracea 40mg  1 po qd with food and drink Cont Metronidazole 0.75% cr qam to face Cont Tretinoin 0.1%  and 0.025% cr prn flares Recommend BBL treatment  Counseling for BBL / IPL / Laser and Coordination of Care (for lentigos and rosacea face) Discussed the treatment option of Broad Band Light (BBL) Windell Moulding Pulsed Light (IPL)/ Laser for skin discoloration, including brown spots and redness.  Typically we recommend at least 1-3 treatment sessions about 5-8 weeks apart for best results.  Cannot have tanned skin when BBL performed, and regular use of sunscreen/photoprotection is advised after the procedure to help maintain results. The patient's condition may also require "maintenance treatments" in the future.  The fee for BBL / laser treatments is $350 per treatment session for the whole face.  A fee can be quoted for other parts of the body.  Insurance typically does not pay for BBL/laser treatments and therefore the fee is an out-of-pocket cost. Recommend prophylactic valtrex treatment. Once scheduled for procedure, will send Rx in prior to patient's appointment.  Patient denies h/o hsv, declines valtrex.   Doxycycline should be taken with food to prevent nausea. Do not lay down for 30 minutes after taking. Be cautious with sun exposure and use good sun protection while on this medication. Pregnant women should not take this medication.    Topical retinoid medications like tretinoin/Retin-A, adapalene/Differin, tazarotene/Fabior, and Epiduo/Epiduo Forte can cause dryness and irritation when first started. Only apply a pea-sized amount to the entire affected area. Avoid applying it around the eyes, edges of mouth and creases at the nose. If you experience irritation, use a good moisturizer first and/or apply the medicine less often. If you are doing well with the medicine, you can increase how often you use it until you are applying every night. Be careful with sun protection while using this medication as it can make you sensitive to the sun. This medicine should not be used by pregnant women.     DERMATOFIBROMA R upper arm, spinal lower back Exam: Firm pink/brown papulenodule with dimple sign R upper arm, spinal lower back. Treatment Plan: A dermatofibroma is a benign growth possibly related to trauma, such as an insect bite, cut from shaving, or inflamed acne-type bump.  Treatment options to remove include shave or excision with resulting scar and risk of recurrence.  Since benign-appearing and not bothersome, will observe for now.    Return in about 1 year (around 08/07/2024) for TBSE.  I, Ardis Rowan, RMA, am acting as scribe for Willeen Niece, MD .   Documentation: I have reviewed the above documentation for accuracy and completeness, and I agree with the above.  Willeen Niece, MD

## 2023-08-09 ENCOUNTER — Other Ambulatory Visit: Payer: Self-pay | Admitting: Dermatology

## 2023-09-19 ENCOUNTER — Ambulatory Visit: Payer: No Typology Code available for payment source | Admitting: Dermatology

## 2023-09-19 ENCOUNTER — Encounter: Payer: Self-pay | Admitting: Dermatology

## 2023-09-19 DIAGNOSIS — L209 Atopic dermatitis, unspecified: Secondary | ICD-10-CM

## 2023-09-19 MED ORDER — TRIAMCINOLONE ACETONIDE 0.1 % EX CREA: TOPICAL_CREAM | CUTANEOUS | 2 refills | Status: AC

## 2023-09-19 NOTE — Patient Instructions (Signed)
Start Triamcinolone 0.1% cream twice daily to affected areas until smooth. Avoid applying to face, groin, and axilla. Use as directed. Long-term use can cause thinning of the skin.  Topical steroids (such as triamcinolone, fluocinolone, fluocinonide, mometasone, clobetasol, halobetasol, betamethasone, hydrocortisone) can cause thinning and lightening of the skin if they are used for too long in the same area. Your physician has selected the right strength medicine for your problem and area affected on the body. Please use your medication only as directed by your physician to prevent side effects.       Due to recent changes in healthcare laws, you may see results of your pathology and/or laboratory studies on MyChart before the doctors have had a chance to review them. We understand that in some cases there may be results that are confusing or concerning to you. Please understand that not all results are received at the same time and often the doctors may need to interpret multiple results in order to provide you with the best plan of care or course of treatment. Therefore, we ask that you please give [place] 2 business days to thoroughly review all your results before contacting the office for clarification. Should we see a critical lab result, you will be contacted sooner.   If You Need Anything After Your Visit  If you have any questions or concerns for your doctor, please call our main line at [phone] and press option 4 to reach your doctor's medical assistant. If no one answers, please leave a voicemail as directed and we will return your call as soon as possible. Messages left after 4 pm will be answered the following business day.   You may also send [place] a message via MyChart. We typically respond to MyChart messages within 1-2 business days.  For prescription refills, please ask your pharmacy to contact our office. Our fax number is [phone].  If you have an urgent issue when the clinic  is closed that cannot wait until the next business day, you can page your doctor at the number below.    Please note that while we do our best to be available for urgent issues outside of office hours, we are not available 24/7.   If you have an urgent issue and are unable to reach [place], you may choose to seek medical care at your doctor's office, retail clinic, urgent care center, or emergency room.  If you have a medical emergency, please immediately call 911 or go to the emergency department.  Pager Numbers  - Dr. Gwen Pounds: [phone]  - Dr. Roseanne Reno: [phone]  - Dr. Katrinka Blazing: [phone]   In the event of inclement weather, please call our main line at [phone] for an update on the status of any delays or closures.  Dermatology Medication Tips: Please keep the boxes that topical medications come in in order to help keep track of the instructions about where and how to use these. Pharmacies typically print the medication instructions only on the boxes and not directly on the medication tubes.   If your medication is too expensive, please contact our office at [phone] option 4 or send [place] a message through MyChart.   We are unable to tell what your co-pay for medications will be in advance as this is different depending on your insurance coverage. However, we may be able to find a substitute medication at lower cost or fill out paperwork to get insurance to cover a needed medication.   If a prior authorization is required  to get your medication covered by your insurance company, please allow [place] 1-2 business days to complete this process.  Drug prices often vary depending on where the prescription is filled and some pharmacies may offer cheaper prices.  The website www.goodrx.com contains coupons for medications through different pharmacies. The prices here do not account for what the cost may be with help from insurance (it may be cheaper with your insurance), but the website  can give you the price if you did not use any insurance.  - You can print the associated coupon and take it with your prescription to the pharmacy.  - You may also stop by our office during regular business hours and pick up a GoodRx coupon card.  - If you need your prescription sent electronically to a different pharmacy, notify our office through [facility] or by phone at [phone] option 4.     Si Usted Necesita Algo Despus de Su Visita  Tambin puede enviarnos un mensaje a travs de Clinical cytogeneticist. Por lo general respondemos a los mensajes de MyChart en el transcurso de 1 a 2 das hbiles.  Para renovar recetas, por favor pida a su farmacia que se ponga en contacto con nuestra oficina. Annie Sable de fax es [place] [phone].  Si tiene un asunto urgente cuando la clnica est cerrada y que no puede esperar hasta el siguiente da hbil, puede llamar/localizar a su doctor(a) al nmero que aparece a continuacin.   Por favor, tenga en cuenta que aunque hacemos todo lo posible para estar disponibles para asuntos urgentes fuera del horario de [place], no estamos disponibles las 24 horas del da, los 7 [address]  [address] de la [place].   Si tiene un problema urgente y no puede comunicarse con nosotros, puede optar por buscar atencin mdica  en el consultorio de su doctor(a), en una clnica privada, en un centro de atencin urgente o en una sala de emergencias.  Si tiene Engineer, drilling, por favor llame inmediatamente al 911 o vaya a la sala de emergencias.  Nmeros de bper  - Dr. Gwen Pounds: [phone]  - Dra. Roseanne Reno: [phone]  - Dr. Katrinka Blazing: [phone]   En caso de inclemencias del tiempo, por favor llame a Lacy Duverney principal al [phone] para una actualizacin sobre el [place] de cualquier retraso o cierre.  Consejos para la medicacin en dermatologa: Por favor, guarde las cajas en las que vienen los medicamentos de uso tpico para ayudarle a seguir las instrucciones  sobre dnde y cmo usarlos. Las farmacias generalmente imprimen las instrucciones del medicamento slo en las cajas y no directamente en los tubos del [place].   Si su medicamento es muy caro, por favor, pngase en contacto con Rolm Gala llamando al [phone] y presione la opcin 4 o envenos un mensaje a travs de Clinical cytogeneticist.   No podemos decirle cul ser su copago por los medicamentos por adelantado ya que esto es diferente dependiendo de la cobertura de su seguro. Sin embargo, es posible que podamos encontrar un medicamento sustituto a Audiological scientist un formulario para que el seguro cubra el medicamento que se considera necesario.   Si se requiere una autorizacin previa para que su compaa de seguros [place] su medicamento, por favor permtanos de 1 a 2 das hbiles para completar [address]  Los precios de los medicamentos varan con frecuencia dependiendo del Environmental consultant de dnde se surte la receta y alguna farmacias pueden ofrecer precios ms baratos.  El sitio web www.goodrx.com tiene cupones para medicamentos de  diferentes farmacias. Los precios aqu no tienen en cuenta lo que podra costar con la ayuda del seguro (puede ser ms barato con su seguro), pero el sitio web puede darle el precio si no utiliz Tourist information centre manager.  - Puede imprimir el cupn correspondiente y llevarlo con su receta a la farmacia.  - Tambin puede pasar por nuestra oficina durante el horario de atencin regular y Education officer, museum una tarjeta de cupones de GoodRx.  - Si necesita que su receta se enve electrnicamente a una farmacia diferente, informe a nuestra oficina a travs de MyChart de [place] o por telfono llamando al [phone] y presione la opcin 4.

## 2023-09-19 NOTE — Progress Notes (Signed)
   Follow Up Visit   Subjective  Gina Gonzales is a 62 y.o. female who presents for the following: Hives. Chest, neck, bends of elbows, back of knees. Dur: ~1 month. Itches, burns. Never goes completely away. Denies changes with soaps, lotions, laundry detergent. No new medications, no dietary changes. Does have seasonal allergies, worse in fall. Has been using Benadryl cream, helps with itching. Takes Zyrtec every evening.   States she does work out in the yard a lot. Does not think she came in contact with poison ivy.    The following portions of the chart were reviewed this encounter and updated as appropriate: medications, allergies, medical history  Review of Systems:  No other skin or systemic complaints except as noted in HPI or Assessment and Plan.  Objective  Well appearing patient in no apparent distress; mood and affect are within normal limits.  A focused examination was performed of the following areas: Chest, elbows, knees  Relevant exam findings are noted in the Assessment and Plan.           Assessment & Plan    Atopic dermatitis, chronic, flaring, not at patient goal  Exam: scaly erythematous plaques at chest, B/L antecubital fossae, B/L popliteal fossae   Treatment Plan: Start Triamcinolone 0.1% cream twice daily to affected areas until smooth. Avoid applying to face, groin, and axilla. Use as directed. Long-term use can cause thinning of the skin.  Topical steroids (such as triamcinolone, fluocinolone, fluocinonide, mometasone, clobetasol, halobetasol, betamethasone, hydrocortisone) can cause thinning and lightening of the skin if they are used for too long in the same area. Your physician has selected the right strength medicine for your problem and area affected on the body. Please use your medication only as directed by your physician to prevent side effects.    Return in about 1 month (around 10/19/2023) for Rash Follow Up.  I, Lawson Radar,  CMA, am acting as scribe for Elie Goody, MD.   Documentation: I have reviewed the above documentation for accuracy and completeness, and I agree with the above.  Elie Goody, MD

## 2023-09-26 ENCOUNTER — Telehealth: Payer: Self-pay

## 2023-09-26 ENCOUNTER — Ambulatory Visit (INDEPENDENT_AMBULATORY_CARE_PROVIDER_SITE_OTHER): Payer: No Typology Code available for payment source | Admitting: Dermatology

## 2023-09-26 ENCOUNTER — Ambulatory Visit: Payer: No Typology Code available for payment source | Admitting: Dermatology

## 2023-09-26 DIAGNOSIS — L814 Other melanin hyperpigmentation: Secondary | ICD-10-CM

## 2023-09-26 DIAGNOSIS — H01134 Eczematous dermatitis of left upper eyelid: Secondary | ICD-10-CM | POA: Diagnosis not present

## 2023-09-26 DIAGNOSIS — H01131 Eczematous dermatitis of right upper eyelid: Secondary | ICD-10-CM

## 2023-09-26 DIAGNOSIS — L719 Rosacea, unspecified: Secondary | ICD-10-CM

## 2023-09-26 MED ORDER — PIMECROLIMUS 1 % EX CREA: TOPICAL_CREAM | CUTANEOUS | 2 refills | Status: AC

## 2023-09-26 NOTE — Patient Instructions (Signed)

## 2023-09-26 NOTE — Progress Notes (Signed)
Follow-Up Visit   Subjective  Gina Gonzales is a 62 y.o. female who presents for the following: Rosacea, face, pt presents for BBL The patient has spots, moles and lesions to be evaluated, some may be new or changing and the patient may have concern these could be cancer.   The following portions of the chart were reviewed this encounter and updated as appropriate: medications, allergies, medical history  Review of Systems:  No other skin or systemic complaints except as noted in HPI or Assessment and Plan.  Objective  Well appearing patient in no apparent distress; mood and affect are within normal limits.    A focused examination was performed of the following areas: face  Relevant exam findings are noted in the Assessment and Plan.                  First Pass   Second pass   Third pass   Assessment & Plan   ROSACEA face Exam Mid face erythema with telangiectasias   Chronic and persistent condition with duration or expected duration over one year. Condition is symptomatic/ bothersome to patient. Not currently at goal.   Rosacea is a chronic progressive skin condition usually affecting the face of adults, causing redness and/or acne bumps. It is treatable but not curable. It sometimes affects the eyes (ocular rosacea) as well. It may respond to topical and/or systemic medication and can flare with stress, sun exposure, alcohol, exercise, topical steroids (including hydrocortisone/cortisone 10) and some foods.  Daily application of broad spectrum spf 30+ sunscreen to face is recommended to reduce flares.  Patient denies grittiness of the eyes  Treatment Plan BBL today, Laser kit and gel cooling mask given to patient today.   [place] BBL - 09/26/23 1400      Patient Details   Skin Type: II    Anesthestic Cream Applied: No    Photo Takes: Yes    Consent Signed: Yes      Treatment Details   Date: 09/26/23    Treatment #: 1    Area: face     Filter: 1st Pass;2nd Pass;3rd Pass      3rd Pass   Location: F   Vascular   Device: 560 filter    BBL j/cm2: 16    PW Msec Sec: 15    Cooling Temp: 15    Pulses: 127    15x15: this crystal used      Patient tolerated the procedure well.   Wynelle Link avoidance was stressed. The patient will call with any problems, questions or concerns prior to their next appointment.  She may continue her topical/oral rosacea medications     LENTIGOS face Exam: brown macules face  Treatment Plan: BBL today   [place] BBL - 09/26/23 1400      Patient Details   Skin Type: II    Anesthestic Cream Applied: No    Photo Takes: Yes    Consent Signed: Yes      Treatment Details   Date: 09/26/23    Treatment #: 1    Area: face    Filter: 1st Pass;2nd Pass;3rd Pass      1st Pass   Location: F   pigmented lesions   Device: 515 filter    BBL j/cm2: 10    PW Msec Sec: 10    Cooling Temp: 15    15x45: this crystal used 70 pulses to cheeks  15x15: --      2nd Pass  Location: F  pigmented lesions  Device: 515 filter     BBL j/cm2: 12    PW Msec Sec: 10    Cooling Temp: 15    Pulses: 163    15x15: this crystal used to forehead, mid face, chin   Patient tolerated the procedure well.   Wynelle Link avoidance was stressed. The patient will call with any problems, questions or concerns prior to their next appointment.    EYELID DERMATITIS eyelids Exam: erythema/scale BL upper eyelids, comes and goes, itchy per patient  Chronic and persistent condition with duration or expected duration over one year. Condition is bothersome/symptomatic for patient. Currently flared.   Atopic dermatitis (eczema) is a chronic, relapsing, pruritic condition that can significantly affect quality of life. It is often associated with allergic rhinitis and/or asthma and can require treatment with topical medications, phototherapy, or in severe cases biologic injectable medication (Dupixent; Adbry) or Oral JAK inhibitors.    Treatment Plan: Start Elidel cr qd/bid aa eyelids until clear, then prn flares      Return for as scheduled.  I, Ardis Rowan, RMA, am acting as scribe for Willeen Niece, MD .   Documentation: I have reviewed the above documentation for accuracy and completeness, and I agree with the above.  Willeen Niece, MD

## 2023-09-26 NOTE — Telephone Encounter (Signed)
Left pt message to see if she could move her appointment today from 1:30 to 2:30 due to the Laser room being double booked at 1:30.  Advised pt to call and let us know if this works./sh

## 2023-09-27 ENCOUNTER — Telehealth: Payer: Self-pay

## 2023-09-27 NOTE — Telephone Encounter (Signed)
Sent in prior authorization to covermymeds for patient's pimecrolimus 1 % cream for eczematous eyelid dermatitis of both upper eyelids

## 2023-10-24 ENCOUNTER — Ambulatory Visit: Payer: No Typology Code available for payment source | Admitting: Dermatology

## 2024-03-28 ENCOUNTER — Ambulatory Visit: Admitting: Dermatology

## 2024-03-28 DIAGNOSIS — L817 Pigmented purpuric dermatosis: Secondary | ICD-10-CM

## 2024-03-28 DIAGNOSIS — L578 Other skin changes due to chronic exposure to nonionizing radiation: Secondary | ICD-10-CM

## 2024-03-28 DIAGNOSIS — L209 Atopic dermatitis, unspecified: Secondary | ICD-10-CM | POA: Diagnosis not present

## 2024-03-28 DIAGNOSIS — I8393 Asymptomatic varicose veins of bilateral lower extremities: Secondary | ICD-10-CM

## 2024-03-28 MED ORDER — OPZELURA 1.5 % EX CREA: TOPICAL_CREAM | CUTANEOUS | 3 refills | Status: DC

## 2024-03-28 MED ORDER — OPZELURA 1.5 % EX CREA: TOPICAL_CREAM | CUTANEOUS | 3 refills | Status: AC

## 2024-03-28 NOTE — Patient Instructions (Addendum)
 Use Opzelura cream, apply to affected areas, red areas, hands twice a day until clear. Safe to apply on eyelids.   Your prescription was sent to [facility] in [place]. A representative from [facility] will contact you within 3 business hours to verify your address and insurance information to schedule a free delivery. If for any reason you do not receive a phone call from them, please reach out to them. Their phone number is [phone] and their hours are Monday-Friday 9:00 am-5:00 pm.    Due to recent changes in healthcare laws, you may see results of your pathology and/or laboratory studies on MyChart before the doctors have had a chance to review them. We understand that in some cases there may be results that are confusing or concerning to you. Please understand that not all results are received at the same time and often the doctors may need to interpret multiple results in order to provide you with the best plan of care or course of treatment. Therefore, we ask that you please give us  2 business days to thoroughly review all your results before contacting the office for clarification. Should we see a critical lab result, you will be contacted sooner.   If You Need Anything After Your Visit  If you have any questions or concerns for your doctor, please call our main line at [phone] and press option 4 to reach your doctor's medical assistant. If no one answers, please leave a voicemail as directed and we will return your call as soon as possible. Messages left after 4 pm will be answered the following business day.   You may also send us  a message via MyChart. We typically respond to MyChart messages within 1-2 business days.  For prescription refills, please ask your pharmacy to contact our office. Our fax number is [phone].  If you have an urgent issue when the clinic is closed that cannot wait until the next business day, you can page your doctor at the number below.     Please note that while we do our best to be available for urgent issues outside of office hours, we are not available 24/7.   If you have an urgent issue and are unable to reach us , you may choose to seek medical care at your doctor's office, retail clinic, urgent care center, or emergency room.  If you have a medical emergency, please immediately call 911 or go to the emergency department.  Pager Numbers  - Dr. Bary Likes: [phone]  - Dr. Annette Barters: [phone]  - Dr. Felipe Horton: [phone]   In the event of inclement weather, please call our main line at [phone] for an update on the status of any delays or closures.  Dermatology Medication Tips: Please keep the boxes that topical medications come in in order to help keep track of the instructions about where and how to use these. Pharmacies typically print the medication instructions only on the boxes and not directly on the medication tubes.   If your medication is too expensive, please contact our office at [phone] option 4 or send us  a message through MyChart.   We are unable to tell what your co-pay for medications will be in advance as this is different depending on your insurance coverage. However, we may be able to find a substitute medication at lower cost or fill out paperwork to get insurance to cover a needed medication.   If a prior authorization is required to get your medication covered by your insurance company,  please allow us  1-2 business days to complete this process.  Drug prices often vary depending on where the prescription is filled and some pharmacies may offer cheaper prices.  The website www.goodrx.com contains coupons for medications through different pharmacies. The prices here do not account for what the cost may be with help from insurance (it may be cheaper with your insurance), but the website can give you the price if you did not use any insurance.  - You can print the associated coupon and  take it with your prescription to the pharmacy.  - You may also stop by our office during regular business hours and pick up a GoodRx coupon card.  - If you need your prescription sent electronically to a different pharmacy, notify our office through [facility] or by phone at [phone] option 4.     Si Usted Necesita Algo Despus de Su Visita  Tambin puede enviarnos un mensaje a travs de Clinical cytogeneticist. Por lo general respondemos a los mensajes de MyChart en el transcurso de 1 a 2 das hbiles.  Para renovar recetas, por favor pida a su farmacia que se ponga en contacto con nuestra oficina. Franz Jacks de fax es [place] [phone].  Si tiene un asunto urgente cuando la clnica est cerrada y que no puede esperar hasta el siguiente da hbil, puede llamar/localizar a su doctor(a) al nmero que aparece a continuacin.   Por favor, tenga en cuenta que aunque hacemos todo lo posible para estar disponibles para asuntos urgentes fuera del horario de [place], no estamos disponibles las 24 horas del da, los 7 [address]  [address] de la [place].   Si tiene un problema urgente y no puede comunicarse con nosotros, puede optar por buscar atencin mdica  en el consultorio de su doctor(a), en una clnica privada, en un centro de atencin urgente o en una sala de emergencias.  Si tiene Engineer, drilling, por favor llame inmediatamente al 911 o vaya a la sala de emergencias.  Nmeros de bper  - Dr. Bary Likes: [phone]  - Dra. Annette Barters: [phone]  - Dr. Felipe Horton: [phone]   En caso de inclemencias del tiempo, por favor llame a Lajuan Pila principal al [phone] para una actualizacin sobre el [place] de cualquier retraso o cierre.  Consejos para la medicacin en dermatologa: Por favor, guarde las cajas en las que vienen los medicamentos de uso tpico para ayudarle a seguir las instrucciones sobre dnde y cmo usarlos. Las farmacias generalmente imprimen las instrucciones del medicamento slo  en las cajas y no directamente en los tubos del [place].   Si su medicamento es muy caro, por favor, pngase en contacto con Bettyjane Brunet llamando al [phone] y presione la opcin 4 o envenos un mensaje a travs de Clinical cytogeneticist.   No podemos decirle cul ser su copago por los medicamentos por adelantado ya que esto es diferente dependiendo de la cobertura de su seguro. Sin embargo, es posible que podamos encontrar un medicamento sustituto a Audiological scientist un formulario para que el seguro cubra el medicamento que se considera necesario.   Si se requiere una autorizacin previa para que su compaa de seguros [place] su medicamento, por favor permtanos de 1 a 2 das hbiles para completar este proceso.  Los precios de los medicamentos varan con frecuencia dependiendo del Environmental consultant de dnde se surte la receta y alguna farmacias pueden ofrecer precios ms baratos.  El sitio web www.goodrx.com tiene cupones para medicamentos de Health and safety inspector. Los precios aqu no tienen en cuenta  lo que podra costar con la ayuda del seguro (puede ser ms barato con su seguro), pero el sitio web puede darle el precio si no Visual merchandiser.  - Puede imprimir el cupn correspondiente y llevarlo con su receta a la farmacia.  - Tambin puede pasar por nuestra oficina durante el horario de atencin regular y Education officer, museum una tarjeta de cupones de GoodRx.  - Si necesita que su receta se enve electrnicamente a una farmacia diferente, informe a nuestra oficina a travs de MyChart de [place] o por telfono llamando al [phone] y presione la opcin 4.

## 2024-03-28 NOTE — Progress Notes (Signed)
 Follow-Up Visit   Subjective  Gina Gonzales is a 63 y.o. female who presents for the following: Patient has been breaking out in hands and in between fingers, does cause skin to split in sections, comes & goes, random little red spots. Patient has noticed skin around knuckles, ankles, arms, and neck are red. Ongoing since beginning of the year. Patient was prescribed TMC 0.1% in November 2024 for atopic derm and patient has been using that cream regularly but not effective.   The patient has spots, moles and lesions to be evaluated, some may be new or changing and the patient may have concern these could be cancer.   The following portions of the chart were reviewed this encounter and updated as appropriate: medications, allergies, medical history  Review of Systems:  No other skin or systemic complaints except as noted in HPI or Assessment and Plan.  Objective  Well appearing patient in no apparent distress; mood and affect are within normal limits.  A focused examination was performed of the following areas: Hands, arm legs, ankles, chest  Relevant exam findings are noted in the Assessment and Plan.  Bilateral lower legs Tiny rust colored macules at BL lower legs  Assessment & Plan   ACTINIC DAMAGE Chest, neck - chronic, secondary to cumulative UV radiation exposure/sun exposure over time - diffuse scaly erythematous macules with underlying dyspigmentation - Recommend daily broad spectrum sunscreen SPF 30+ to sun-exposed areas, reapply every 2 hours as needed.  - Recommend staying in the shade or wearing long sleeves, sun glasses (UVA+UVB protection) and wide brim hats (4-inch brim around the entire circumference of the hat). - Call for new or changing lesions.   ATOPIC DERMATITIS- HANDS, LEGS, ARMS- tried and failed TMC, pimecrolimus  Exam: Pink patches at R popliteal, L lateral ankle with erythema, pink scaly patches on fingers, web spaces, erythema at MCPs, Fissure  at r index fingertip, few small light pink patches on arms.  BSA 3%  Chronic and persistent condition with duration or expected duration over one year. Condition is bothersome/symptomatic for patient. Currently flared.  Hand Dermatitis is a chronic type of eczema that can come and go on the hands and fingers.  While there is no cure, the rash and symptoms can be managed with topical prescription medications, and for more severe cases, with systemic medications.  Recommend mild soap and routine use of moisturizing cream after handwashing.  Minimize soap/water exposure when possible.   Atopic dermatitis (eczema) is a chronic, relapsing, pruritic condition that can significantly affect quality of life. It is often associated with allergic rhinitis and/or asthma and can require treatment with topical medications, phototherapy, or in severe cases biologic injectable medication (Dupixent; Adbry) or Oral JAK inhibitors.   Treatment Plan Start Opzelura 1.5% cream, apply to aa, red areas, hands, arms, legs BID until itchy rash clear. Safe to apply to face/body folds if needed  Recommend mild soap and gentle moisturizer after hand-washing, such as dove moisturizing hand soap, Neutrogena Hydroboost. Samples given to patient.    Recommend protecting lower ankles with socks prior to using ankle bands in the gym due to irritation at ankle pressure point.   Varicose Veins/Spider Veins Left medial knee, left ankle - Dilated blue, purple or red veins at the lower extremities - Reassured - Smaller vessels can be treated by sclerotherapy (a procedure to inject a medicine into the veins to make them disappear) if desired, but the treatment is not covered by insurance. Larger vessels may be  covered if symptomatic and we would refer to vascular surgeon if treatment desired. - Would recommend sclero before doing laser.  - Recommend wearing compression socks after procedure.  SCHAMBERG'S PURPURA Bilateral lower  legs Benign, observe.   Recommend wearing compression hose daily.  Return for As scheduled, w/ Dr. Annette Barters.  I, Jacquelynn Vera, CMA, am acting as scribe for Artemio Larry, MD .   Documentation: I have reviewed the above documentation for accuracy and completeness, and I agree with the above.  Artemio Larry, MD

## 2024-05-02 IMAGING — MG MM DIGITAL SCREENING BILAT W/ TOMO AND CAD
8 series · 8 of 24 positions shown · non-contrast
Comparison: Previous exam(s).

CLINICAL DATA: Screening.

EXAM:
DIGITAL SCREENING BILATERAL MAMMOGRAM WITH TOMOSYNTHESIS AND CAD
TECHNIQUE: Bilateral screening digital craniocaudal and mediolateral oblique
mammograms were obtained. Bilateral screening digital breast
tomosynthesis was performed. The images were evaluated with
computer-aided detection.

[R MLO synth-2D]
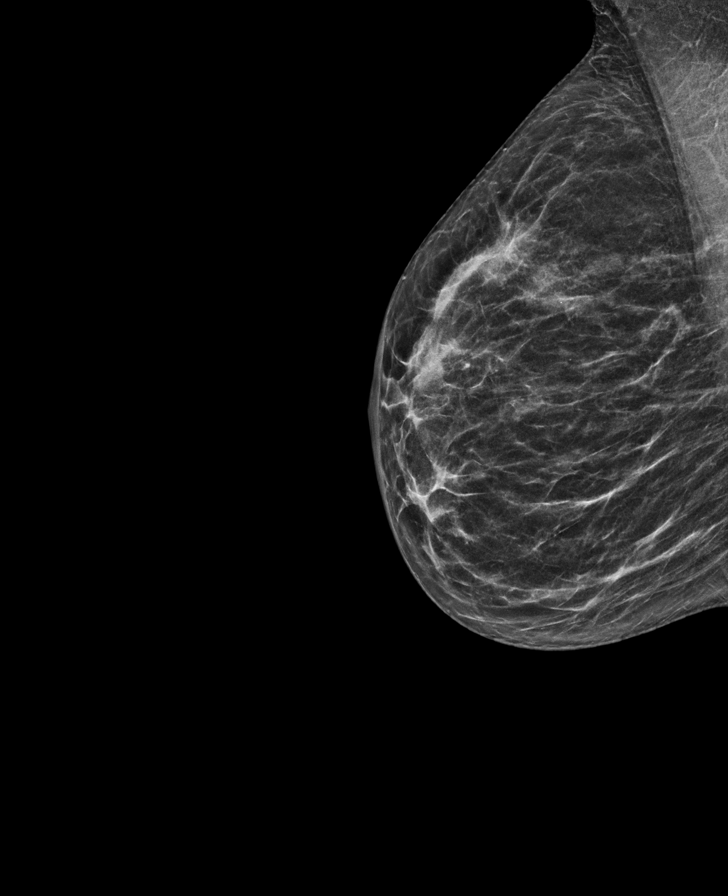

[R CC synth-2D]
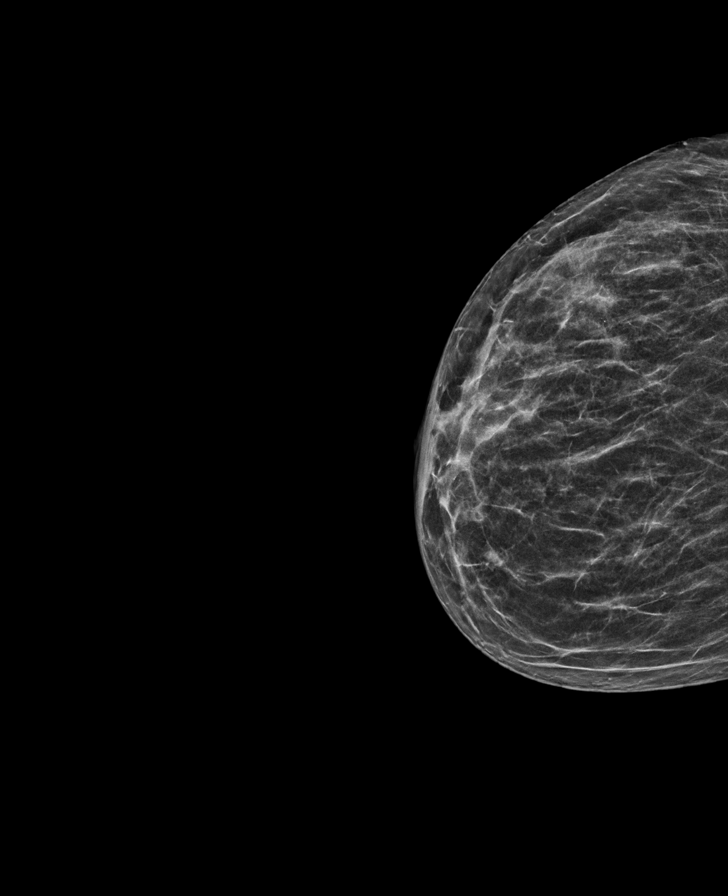

[L MLO synth-2D]
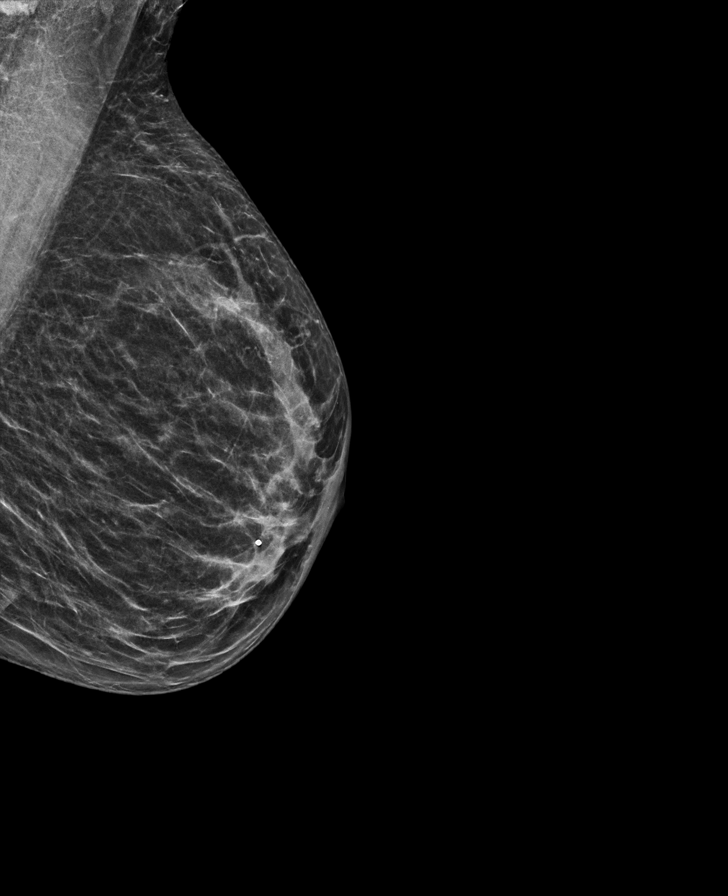

[L CC synth-2D]
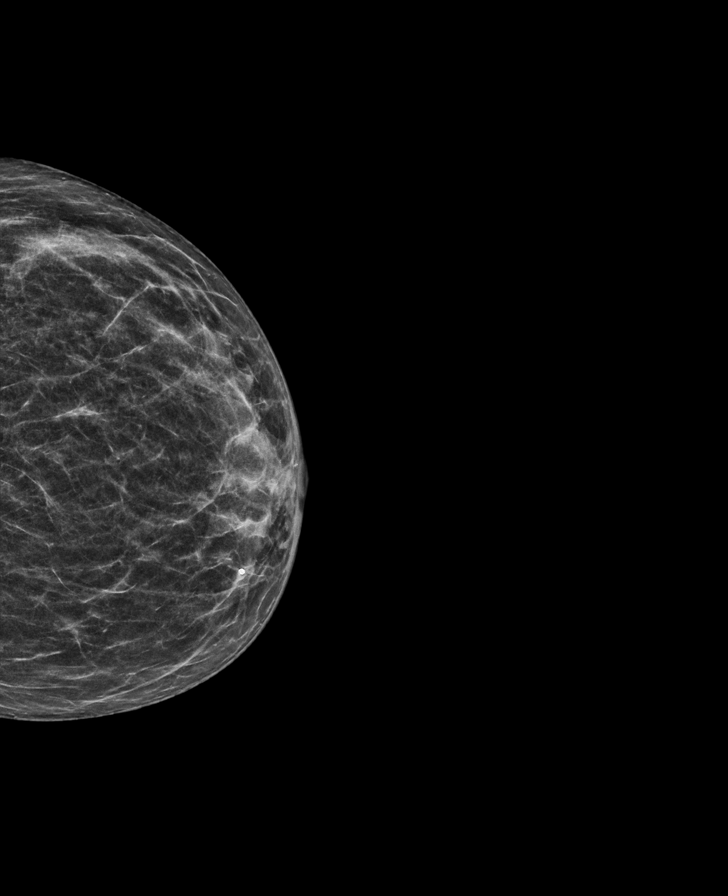

[R MLO tomo · tomo slice 27/53.0]
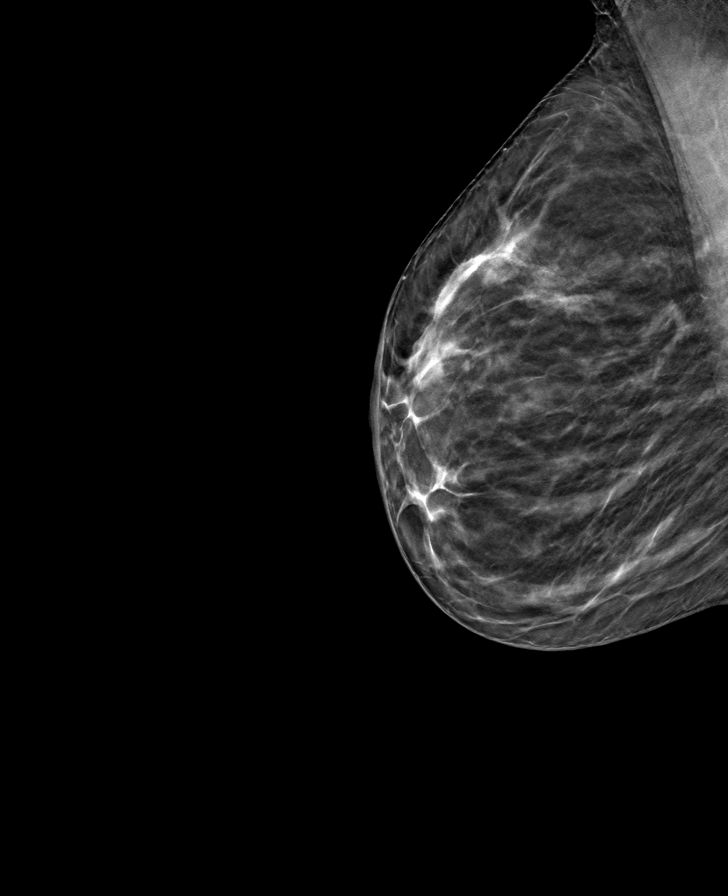

[R CC tomo · tomo slice 27/53.0]
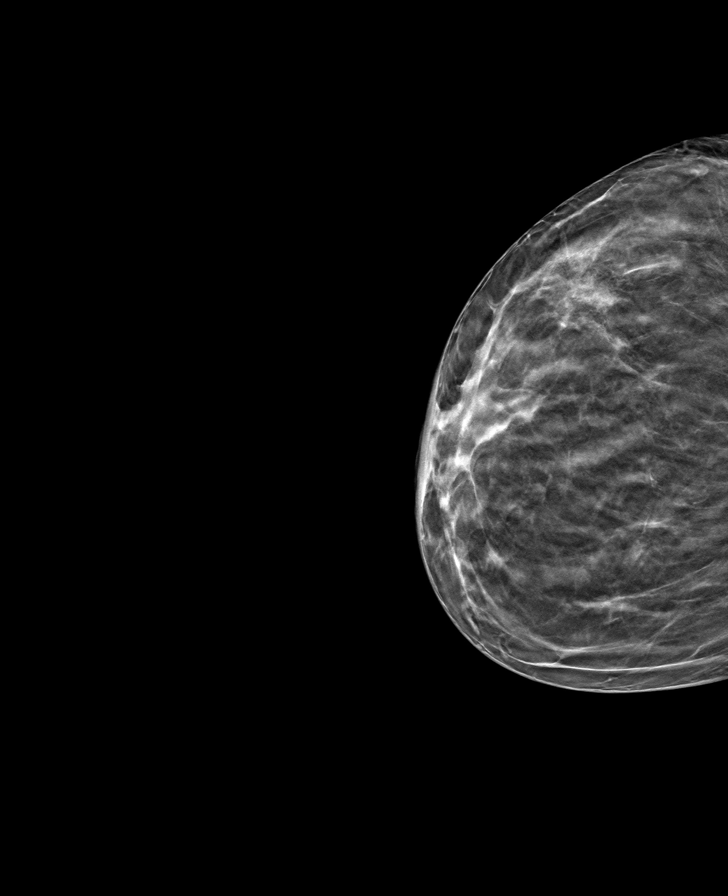

[L CC tomo · tomo slice 28/55.0]
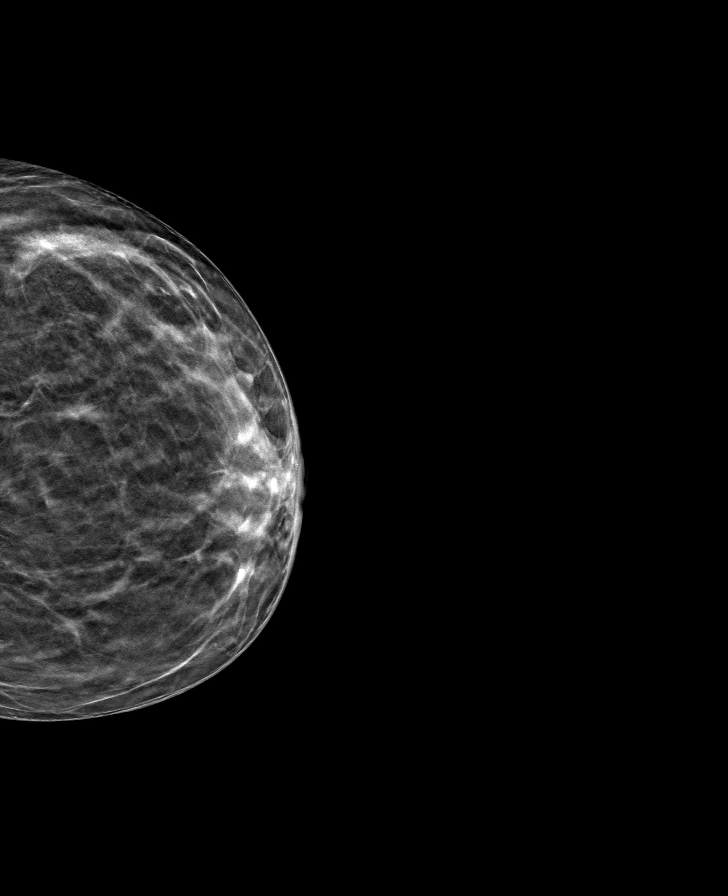

[L MLO tomo · tomo slice 28/55.0]
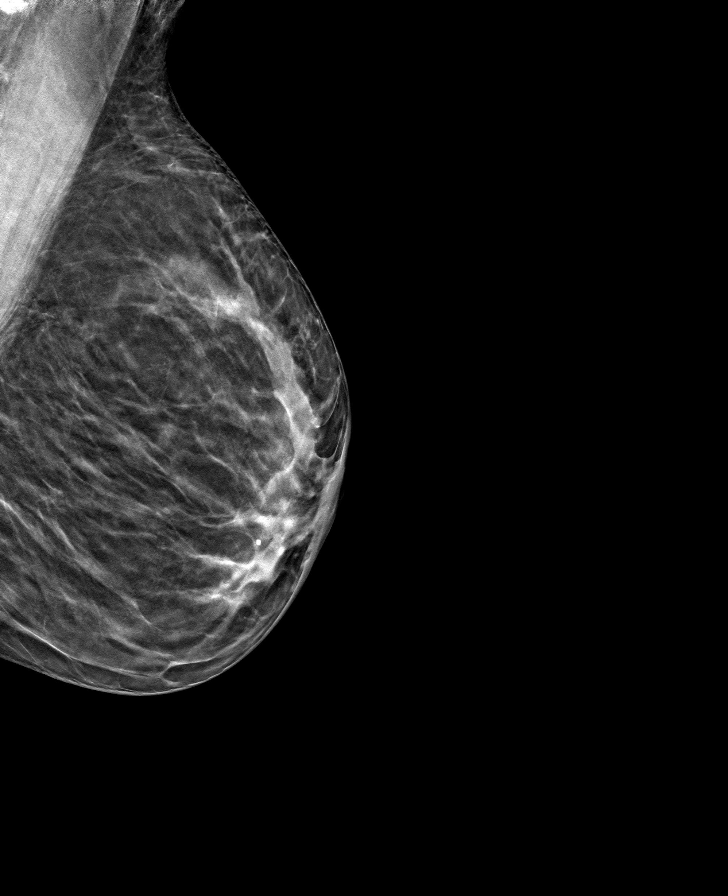

[8 of 24 positions shown; findings below may reference images not displayed]

ACR Breast Density Category b: There are scattered areas of
fibroglandular density.
FINDINGS: There are no findings suspicious for malignancy.
IMPRESSION: No mammographic evidence of malignancy. A result letter of this
screening mammogram will be mailed directly to the patient.

RECOMMENDATION:
Screening mammogram in one year. (Code:51-O-LD2)

BI-RADS CATEGORY  1: Negative.

## 2024-05-29 ENCOUNTER — Other Ambulatory Visit: Payer: Self-pay | Admitting: Internal Medicine

## 2024-05-29 DIAGNOSIS — Z1231 Encounter for screening mammogram for malignant neoplasm of breast: Secondary | ICD-10-CM

## 2024-05-30 ENCOUNTER — Ambulatory Visit (INDEPENDENT_AMBULATORY_CARE_PROVIDER_SITE_OTHER): Payer: Self-pay | Admitting: Dermatology

## 2024-05-30 DIAGNOSIS — L988 Other specified disorders of the skin and subcutaneous tissue: Secondary | ICD-10-CM

## 2024-05-30 NOTE — Patient Instructions (Addendum)

## 2024-05-30 NOTE — Progress Notes (Signed)
 Follow-Up Visit   Subjective  Gina Gonzales is a 63 y.o. female who presents for the following: Botox and fillers for facial elastosis   The following portions of the chart were reviewed this encounter and updated as appropriate: medications, allergies, medical history  Review of Systems:  No other skin or systemic complaints except as noted in HPI or Assessment and Plan.  Objective  Well appearing patient in no apparent distress; mood and affect are within normal limits.  A focused examination was performed of the face.  Relevant physical exam findings are noted in the Assessment and Plan. Face   Assessment & Plan     BEFORE BOTOX AND FILLERS                        AFTER BOTOX AND FILLER               ELASTOSIS OF SKIN Face Location: See attached image  Botox 27.5 units injected as marked: - Frown complex 20.0 - Forehead 5.0 units - botox comma 1.25 units each side for a total of 2.5 units  Informed consent: Discussed risks (infection, pain, bleeding, bruising, swelling, allergic reaction, paralysis of nearby muscles, eyelid droop, double vision, neck weakness, difficulty breathing, headache, undesirable cosmetic result, and need for additional treatment) and benefits of the procedure, as well as the alternatives.  Informed consent was obtained.  Preparation: The area was cleansed with alcohol.  Procedure Details:  Botox was injected into the dermis with a 30-gauge needle. Pressure applied to any bleeding. Ice packs offered for swelling.  Lot Number:  [id number] Expiration:  03/2026  Total Units Injected: 27.5 units  Plan: Tylenol may be used for headache.  Allow 2 weeks before returning to clinic for additional dosing as needed. Patient will call for any problems. Filling material injection - Face Patient was wanting to discuss fillers at temples, patient advised that there are a lot of vessels at temples and temples that  connect to the back of eye, if injected into a vessel can cause blindness and discussed that Dr.S does not do that here. Patient advised that she can do fillers at mid face.   Filler for mid face and lip would use 2 syringes, would use two different fillers for mid face and lip. Voluma and Volbella (0.55 ml).  Only injected Voluma today, pt will return for Volbella.  Voluma 1.0 cc injected as marked: - L cheek 0.6 cc - R cheek 0.4 cc  Prior to the procedure, the patient's past medical history, allergies and the rare but potential risks and complications were reviewed with the patient and a signed consent was obtained. Pre and post-treatment care was discussed and instructions provided.  Location: cheeks  Filler Type: Juvederm Voluma 1.0 cc Lot number: [id number] Expiration date: 04/17/2025  Procedure: The area was prepped thoroughly with Puracyn. After introducing the needle into the desired treatment area, the syringe plunger was drawn back to ensure there was no flash of blood prior to injecting the filler in order to minimize risk of intravascular injection and vascular occlusion. After injection of the filler, the treated areas were cleansed and iced to reduce swelling. Post-treatment instructions were reviewed with the patient.       Patient tolerated the procedure well. The patient will call with any problems, questions or concerns prior to their next appointment.     Return for procedure as scheduled with Dr. Jackquline for filler at lips.  I,  Gina Gonzales, CMA, am acting as scribe for Rexene Rattler, MD   Documentation: I have reviewed the above documentation for accuracy and completeness, and I agree with the above.  Rexene Rattler, MD

## 2024-06-19 ENCOUNTER — Ambulatory Visit
Admission: RE | Admit: 2024-06-19 | Discharge: 2024-06-19 | Disposition: A | Discharge status: Home / Self Care | Source: Ambulatory Visit | Attending: Internal Medicine | Admitting: Internal Medicine

## 2024-06-19 DIAGNOSIS — Z1231 Encounter for screening mammogram for malignant neoplasm of breast: Secondary | ICD-10-CM | POA: Insufficient documentation

## 2024-07-30 ENCOUNTER — Ambulatory Visit

## 2024-07-30 DIAGNOSIS — L821 Other seborrheic keratosis: Secondary | ICD-10-CM | POA: Diagnosis not present

## 2024-07-30 DIAGNOSIS — I789 Disease of capillaries, unspecified: Secondary | ICD-10-CM

## 2024-07-30 DIAGNOSIS — L309 Dermatitis, unspecified: Secondary | ICD-10-CM

## 2024-07-30 DIAGNOSIS — I788 Other diseases of capillaries: Secondary | ICD-10-CM

## 2024-07-30 NOTE — Progress Notes (Deleted)
   Follow Up Visit   Subjective  Gina Gonzales is a 63 y.o. female who presents for the following: Rash of the neck off and on since January 2025, neck always itchy, has skin tag like bumps, that wasn't there before. Hx of rash on the legs pt has a hx of eczema and has been using Opzelura  cream, but hasn't noticed any improvement. Currently she is using OTC oatmeal moisturizer and is concerned she may have some type of allergy.  ***  The following portions of the chart were reviewed this encounter and updated as appropriate: medications, allergies, medical history  Review of Systems:  No other skin or systemic complaints except as noted in HPI or Assessment and Plan.  Objective  Well appearing patient in no apparent distress; mood and affect are within normal limits.  A focused examination was performed of the following areas: ***  Relevant exam findings are noted in the Assessment and Plan.    Assessment & Plan     RASH Exam: ***  Wellcontrolled vs flared vs notatgoal IF THIS IS CHRONIC***  Treatment Plan: ***   No follow-ups on file.  ***  Documentation: I have reviewed the above documentation for accuracy and completeness, and I agree with the above.  Lauraine JAYSON Kanaris, MD

## 2024-07-30 NOTE — Patient Instructions (Signed)

## 2024-07-30 NOTE — Progress Notes (Signed)
 Subjective   Gina Gonzales is a 63 y.o. female who presents for the following: Rash. Patient is established patient   Today patient reports: Rash of the neck off and on since January 2025, neck always itchy, has skin tag like bumps, that wasn't there before. Hx of rash on the legs pt has a hx of eczema and has been using Opzelura  cream, but hasn't noticed any improvement. Currently she is using OTC oatmeal moisturizer/Derma E and is concerned she may have some type of allergy. Pt did do a lot of walking this past weekend at the beach. Flares after standing for prolonged period.   Review of Systems:    No other skin or systemic complaints except as noted in HPI or Assessment and Plan.  The following portions of the chart were reviewed this encounter and updated as appropriate: medications, allergies, medical history  Relevant Medical History:  n/a   Objective  Well appearing patient in no apparent distress; mood and affect are within normal limits. Examination was performed of the: Focused Exam of: the face, neck, chest, and legs    Examination notable for: Seborrheic Keratosis(es): Stuck-on appearing keratotic papule(s) on the trunk, some  irritated with redness, crusting, edema, and/or partial avulsion  - Non-palpable cayenne pepper-like purpura on a background of brown-orange discoloration with some coalescence on the lower legs    Assessment & Plan   Pinpoint nonblanching petechiae and pink macules/plaques, clinically consistent with capillaritis Chronic and persistent condition with duration or expected duration over one year. Condition is symptomatic and bothersome to patient. Patient is flaring and not currently at treatment goal.  Capillaritis is a benign cutaneous eruption characterized by cayenne pepper-like petechiae, purpura, and golden-brown pigmentation on the legs and less commonly on the trunk and upper extremities. Development or worsening of pre-existing  capillaritis has been associated with venous hypertension, increased capillary fragility, gravity, and exercise. It is typically asymptomatic but may be pruritic. Compression stockings may be helpful when swelling is present, such as in this patient. Mid-potency topical steroids such as triamcinolone  ointment are helpful to alleviate pruritis and inflammation.  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 Condition is symptomatic and bothersome to patient. Patient is flaring and not currently at treatment goal.  - Diagnosis, treatment options, prognosis, risk/ benefit, and side effects of treatment were discussed with the patient - Discussed that this dermatitis can develop at any time and may be new or old products or other substances that the patient is coming into contact with - Recommended gentle skin care, including avoidance of fragrance products, harsh soaps and detergents - - Continue ruxolitinib 1.5% cream twice daily prn for flares   - Discussed side effects including acne, redness/itching at site of application   Recommend hypoallergenic products like Vanicream or CeraVe. Avoid wearing jewelry.  Recommend taking photos to upload to MyChart when a flare occurs. Consider patch testing if pt continues to flare.  Seborrheic keratosis, inflamed - Discussed diagnosis, typical course, and treatment options for this condition - Reassurance, benign, monitor - ABCDE's discussed - Deferred cryo   Procedures, orders, diagnosis for this visit:    There are no diagnoses linked to this encounter.  Return to clinic: Return for appointment as scheduled.  LILLETTE Rosina Mayans, CMA, am acting as scribe  for Lauraine JAYSON Kanaris, MD .  Documentation: I have reviewed the above documentation for accuracy and completeness, and I agree with the above.  Lauraine JAYSON Kanaris, MD

## 2024-08-02 ENCOUNTER — Ambulatory Visit

## 2024-08-20 ENCOUNTER — Ambulatory Visit: Payer: No Typology Code available for payment source | Admitting: Dermatology

## 2024-08-20 DIAGNOSIS — Z1283 Encounter for screening for malignant neoplasm of skin: Secondary | ICD-10-CM

## 2024-08-20 DIAGNOSIS — D2271 Melanocytic nevi of right lower limb, including hip: Secondary | ICD-10-CM

## 2024-08-20 DIAGNOSIS — L814 Other melanin hyperpigmentation: Secondary | ICD-10-CM

## 2024-08-20 DIAGNOSIS — W908XXA Exposure to other nonionizing radiation, initial encounter: Secondary | ICD-10-CM

## 2024-08-20 DIAGNOSIS — L821 Other seborrheic keratosis: Secondary | ICD-10-CM

## 2024-08-20 DIAGNOSIS — D225 Melanocytic nevi of trunk: Secondary | ICD-10-CM

## 2024-08-20 DIAGNOSIS — D239 Other benign neoplasm of skin, unspecified: Secondary | ICD-10-CM

## 2024-08-20 DIAGNOSIS — D229 Melanocytic nevi, unspecified: Secondary | ICD-10-CM

## 2024-08-20 DIAGNOSIS — L578 Other skin changes due to chronic exposure to nonionizing radiation: Secondary | ICD-10-CM | POA: Diagnosis not present

## 2024-08-20 DIAGNOSIS — L820 Inflamed seborrheic keratosis: Secondary | ICD-10-CM | POA: Diagnosis not present

## 2024-08-20 DIAGNOSIS — L988 Other specified disorders of the skin and subcutaneous tissue: Secondary | ICD-10-CM

## 2024-08-20 DIAGNOSIS — L719 Rosacea, unspecified: Secondary | ICD-10-CM | POA: Diagnosis not present

## 2024-08-20 DIAGNOSIS — D2272 Melanocytic nevi of left lower limb, including hip: Secondary | ICD-10-CM

## 2024-08-20 DIAGNOSIS — D1801 Hemangioma of skin and subcutaneous tissue: Secondary | ICD-10-CM

## 2024-08-20 DIAGNOSIS — D235 Other benign neoplasm of skin of trunk: Secondary | ICD-10-CM

## 2024-08-20 DIAGNOSIS — D2361 Other benign neoplasm of skin of right upper limb, including shoulder: Secondary | ICD-10-CM

## 2024-08-20 MED ORDER — TRETINOIN 0.025 % EX CREA
TOPICAL_CREAM | Freq: Every day | CUTANEOUS | 5 refills | Status: AC
Start: 2024-08-20 — End: 2025-08-20

## 2024-08-20 MED ORDER — IVERMECTIN 1 % EX CREA
1.0000 | TOPICAL_CREAM | Freq: Every day | CUTANEOUS | 11 refills | Status: AC
Start: 2024-08-20 — End: ?

## 2024-08-20 MED ORDER — METRONIDAZOLE 0.75 % EX CREA
TOPICAL_CREAM | CUTANEOUS | 11 refills | Status: DC
Start: 2024-08-20 — End: 2024-09-20

## 2024-08-20 MED ORDER — ORACEA 40 MG PO CPDR
1.0000 | DELAYED_RELEASE_CAPSULE | Freq: Every day | ORAL | 11 refills | Status: AC
Start: 2024-08-20 — End: ?

## 2024-08-20 NOTE — Progress Notes (Signed)
 Follow-Up Visit   Subjective  Gina Gonzales is a 63 y.o. female who presents for the following: Skin Cancer Screening and Full Body Skin Exam  The patient presents for Total-Body Skin Exam (TBSE) for skin cancer screening and mole check. The patient has spots, moles and lesions to be evaluated, some may be new or changing. She has growths on the neck that itch and burn. Rosacea is controlled with current treatment. She has had BBL in past with good result.  The following portions of the chart were reviewed this encounter and updated as appropriate: medications, allergies, medical history  Review of Systems:  No other skin or systemic complaints except as noted in HPI or Assessment and Plan.  Objective  Well appearing patient in no apparent distress; mood and affect are within normal limits.  A full examination was performed including scalp, head, eyes, ears, nose, lips, neck, chest, axillae, abdomen, back, buttocks, bilateral upper extremities, bilateral lower extremities, hands, feet, fingers, toes, fingernails, and toenails. All findings within normal limits unless otherwise noted below.   Relevant physical exam findings are noted in the Assessment and Plan.    Assessment & Plan   SKIN CANCER SCREENING PERFORMED TODAY.  ACTINIC DAMAGE - Chronic condition, secondary to cumulative UV/sun exposure - diffuse scaly erythematous macules with underlying dyspigmentation - Recommend daily broad spectrum sunscreen SPF 30+ to sun-exposed areas, reapply every 2 hours as needed.  - Staying in the shade or wearing long sleeves, sun glasses (UVA+UVB protection) and wide brim hats (4-inch brim around the entire circumference of the hat) are also recommended for sun protection.  - Call for new or changing lesions.  LENTIGINES, SEBORRHEIC KERATOSES, HEMANGIOMAS - Benign normal skin lesions - Benign-appearing - Call for any changes  MELANOCYTIC NEVI - Tan-brown and/or pink-flesh-colored  symmetric macules and papules - R spinal lower back 2.0 mm med dark brown thin papule, stable - L ant thigh 4.0 mm brown macule - R med breast 6 mm med brown macule (vs SK) - R post thigh 1 mm med dark brown macule - Benign appearing on exam today - Observation - Call clinic for new or changing moles - Recommend daily use of broad spectrum spf 30+ sunscreen to sun-exposed areas.   LENTIGO R great toe medial proximal nail fold Exam: 1.81mm light brown macule   Treatment Plan: Benign-appearing. Stable compared to previous visit. Observation.  Call clinic for new or changing moles.  Recommend daily use of broad spectrum spf 30+ sunscreen to sun-exposed areas.  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 Currently well-controlled.    Rosacea is a chronic progressive skin condition usually affecting the face of adults, causing redness and/or acne bumps. It is treatable but not curable. It sometimes affects the eyes (ocular rosacea) as well. It may respond to topical and/or systemic medication and can flare with stress, sun exposure, alcohol, exercise, topical steroids (including hydrocortisone /cortisone 10) and some foods.  Daily application of broad spectrum spf 30+ sunscreen to face is recommended to reduce flares.   Patient denies grittiness of the eyes   Treatment Plan Cont Soolantra  cr qhs to face Cont Oracea  40mg  1 po qd with food and drink Cont Metronidazole  0.75% cr qam to face Cont Tretinoin  0.025% cr prn flares  DERMATOFIBROMA R upper arm, spinal lower back Exam: Firm pink/brown papulenodule with dimple sign R upper arm, spinal lower back.  Treatment Plan: A dermatofibroma is a  benign growth possibly related to trauma, such as an insect bite, cut from shaving, or inflamed acne-type bump.  Treatment options to remove include shave or excision with resulting scar and risk of recurrence.  Since benign-appearing  and not bothersome, will observe for now.    FACIAL ELASTOSIS Exam: Rhytides and volume loss.  Treatment Plan: Discussed filler, patient may prefer half syringe Volbella to lips. Patient will notify us  if she wants so we can order ahead of time.   Recommend daily broad spectrum sunscreen SPF 30+ to sun-exposed areas, reapply every 2 hours as needed. Call for new or changing lesions.  Staying in the shade or wearing long sleeves, sun glasses (UVA+UVB protection) and wide brim hats (4-inch brim around the entire circumference of the hat) are also recommended for sun protection.   INFLAMED SEBORRHEIC KERATOSIS (7) Neck (7) Symptomatic, irritating, patient would like treated. Destruction of lesion - Neck (7)  Destruction method: cryotherapy   Informed consent: discussed and consent obtained   Lesion destroyed using liquid nitrogen: Yes   Region frozen until ice ball extended beyond lesion: Yes   Outcome: patient tolerated procedure well with no complications   Post-procedure details: wound care instructions given   Additional details:  Prior to procedure, discussed risks of blister formation, small wound, skin dyspigmentation, or rare scar following cryotherapy. Recommend Vaseline ointment to treated areas while healing.   ROSACEA   Related Medications Ivermectin  (SOOLANTRA ) 1 % CREA Apply 1 Application topically at bedtime. qhs to face for rosacea Doxycycline , Rosacea, (ORACEA ) 40 MG CPDR Take 1 capsule by mouth daily. Take with food. metroNIDAZOLE  (METROCREAM ) 0.75 % cream Apply topically 1-2 times a day for rosacea. tretinoin  (RETIN-A ) 0.025 % cream Apply topically at bedtime. Return in about 1 year (around 08/20/2025) for TBSE.  IAndrea Kerns, CMA, am acting as scribe for Rexene Rattler, MD .   Documentation: I have reviewed the above documentation for accuracy and completeness, and I agree with the above.  Rexene Rattler, MD

## 2024-08-20 NOTE — Patient Instructions (Addendum)

## 2024-09-20 ENCOUNTER — Other Ambulatory Visit: Payer: Self-pay | Admitting: Dermatology

## 2024-09-20 DIAGNOSIS — L719 Rosacea, unspecified: Secondary | ICD-10-CM

## 2024-10-07 ENCOUNTER — Encounter: Payer: Self-pay | Admitting: Emergency Medicine

## 2024-10-07 ENCOUNTER — Ambulatory Visit
Admission: EM | Admit: 2024-10-07 | Discharge: 2024-10-07 | Disposition: A | Discharge status: Home / Self Care | Attending: Emergency Medicine | Admitting: Emergency Medicine

## 2024-10-07 DIAGNOSIS — B349 Viral infection, unspecified: Secondary | ICD-10-CM

## 2024-10-07 LAB — POC COVID19/FLU A&B COMBO
Covid Antigen, POC: NEGATIVE
Influenza A Antigen, POC: NEGATIVE
Influenza B Antigen, POC: NEGATIVE

## 2024-10-07 NOTE — ED Provider Notes (Signed)
 CAY RALPH PELT    CSN: [id number] Arrival date & time: 10/07/24  9156      History   Chief Complaint Chief Complaint  Patient presents with   Sore Throat   Cough   Chills   Fatigue    HPI Gina Gonzales is a 63 y.o. female.   Patient presents for evaluation of chills, nasal congestion, mild sinus pain and pressure to the bridge of the nose, sore throat and a nonproductive cough beginning 2 days ago.  No known sick contacts.  Tolerable food and liquids.  Has attempted use of ibuprofen.  History of exercise-induced asthma, denies shortness of breath and wheezing.         Past Medical History:  Diagnosis Date   Atypical chest pain    Exercise-induced asthma    Fatigue    hx   Perimenopause    Rosacea    SVT (supraventricular tachycardia)    Tachycardia     Patient Active Problem List   Diagnosis Date Noted   SUPRAVENTRICULAR TACHYCARDIA 09/10/2009    Past Surgical History:  Procedure Laterality Date   COLONOSCOPY N/A 06/15/2023   Procedure: COLONOSCOPY;  Surgeon: Toledo, Ladell POUR, MD;  Location: [facility] ENDOSCOPY;  Service: Gastroenterology;  Laterality: N/A;   POLYPECTOMY  06/15/2023   Procedure: POLYPECTOMY;  Surgeon: Toledo, Ladell POUR, MD;  Location: [facility] ENDOSCOPY;  Service: Gastroenterology;;    OB History   No obstetric history on file.      Home Medications    Prior to Admission medications   Medication Sig Start Date End Date Taking? Authorizing Provider  Calcium Carbonate-Vitamin D (CALTRATE 600+D) 600-400 MG-UNIT per tablet Take 1 tablet by mouth 2 (two) times daily.      [provider]  cetirizine (ZYRTEC) 10 MG tablet Take by mouth.    [provider]  cetirizine (ZYRTEC) 10 MG tablet Take 10 mg by mouth daily.    [provider]  chlorpheniramine (ALLERGY RELIEF) 4 MG tablet Take 4 mg by mouth daily. In fall     [provider]  Doxycycline , Rosacea, (ORACEA ) 40 MG CPDR Take 1 capsule by mouth  daily. Take with food. 08/20/24   Stewart, Tara, MD  fluticasone [facility]) 50 MCG/ACT nasal spray Place into the nose. 06/09/20   [provider]  hydrocortisone  2.5 % cream Apply 1-2 times a day to affected areas face as directed. Patient not taking: Reported on 07/30/2024 07/21/21   Jackquline Sawyer, MD  Ivermectin  (SOOLANTRA ) 1 % CREA Apply 1 Application topically at bedtime. qhs to face for rosacea 08/20/24   Jackquline Sawyer, MD  ketoconazole  (NIZORAL ) 2 % cream Apply 1-2 times a day to affected areas face as directed. Patient not taking: Reported on 07/30/2024 07/21/21   Stewart, Tara, MD  magnesium 30 MG tablet Take 30 mg by mouth 2 (two) times daily. Patient not taking: Reported on 07/30/2024    [provider]  metroNIDAZOLE  (METROCREAM ) 0.75 % cream APPLY TOPICALLY EVERY MORNING TO FACE FOR ROSACEA 09/20/24   Jackquline Sawyer, MD  Multiple Vitamins-Minerals (ONE-A-DAY EXTRAS ANTIOXIDANT) CAPS Take 1 capsule by mouth daily.      [provider]  pimecrolimus  (ELIDEL ) 1 % cream Apply topically as directed. qd to bid aa eyelids until clear, then prn flares Patient not taking: Reported on 07/30/2024 09/26/23   Jackquline Sawyer, MD  Ruxolitinib Phosphate  (OPZELURA ) 1.5 % CREA Apply to aa, red areas BID. Safe to apply on eyelids. 03/28/24   Jackquline Sawyer, MD  tretinoin  (RETIN-A ) 0.025 % cream Apply topically at bedtime. 08/20/24 08/20/25  Jackquline Sawyer, MD  triamcinolone  cream (KENALOG ) 0.1 % Apply twice daily to affected areas until smooth. Avoid applying to face, groin, and axilla. Patient not taking: Reported on 07/30/2024 09/19/23   Claudene Lehmann, MD    Family History Family History  Problem Relation Age of Onset   Diabetes Other        family hx - DM   Heart failure Other        family hx - CHF   Coronary artery disease Other        family hx   Breast cancer Neg Hx     Social History Social History   Tobacco Use   Smoking status: Never   Smokeless tobacco: Never    Tobacco comments:    tobacco use - no  Substance Use Topics   Alcohol use: Yes    Comment: occasional   Drug use: No     Allergies   Naproxen and Penicillins   Review of Systems Review of Systems  Constitutional:  Positive for chills. Negative for activity change, appetite change, diaphoresis, fatigue, fever and unexpected weight change.  HENT:  Positive for congestion, sinus pressure, sinus pain and sore throat. Negative for dental problem, drooling, ear discharge, ear pain, facial swelling, hearing loss, mouth sores, nosebleeds, postnasal drip, rhinorrhea, sneezing, tinnitus, trouble swallowing and voice change.   Respiratory:  Positive for cough. Negative for apnea, choking, chest tightness, shortness of breath, wheezing and stridor.   Gastrointestinal: Negative.      Physical Exam Triage Vital Signs ED Triage Vitals  Encounter Vitals Group     BP 10/07/24 0937 116/73     Girls Systolic BP Percentile --      Girls Diastolic BP Percentile --      Boys Systolic BP Percentile --      Boys Diastolic BP Percentile --      Pulse Rate 10/07/24 0937 65     Resp 10/07/24 0937 16     Temp 10/07/24 0937 98.2 F (36.8 C)     Temp Source 10/07/24 0937 Oral     SpO2 10/07/24 0937 98 %     Weight --      Height --      Head Circumference --      Peak Flow --      Pain Score 10/07/24 0940 7     Pain Loc --      Pain Education --      Exclude from Growth Chart --    No data found.  Updated Vital Signs BP 116/73 (BP Location: Left Arm)   Pulse 65   Temp 98.2 F (36.8 C) (Oral)   Resp 16   SpO2 98%   Visual Acuity Right Eye Distance:   Left Eye Distance:   Bilateral Distance:    Right Eye Near:   Left Eye Near:    Bilateral Near:     Physical Exam Constitutional:      Appearance: Normal appearance.  HENT:     Right Ear: Tympanic membrane and ear canal normal.     Left Ear: Tympanic membrane and ear canal normal.     Nose: Congestion present.     Mouth/Throat:      Pharynx: Posterior oropharyngeal erythema present. No oropharyngeal exudate.  Cardiovascular:     Rate and Rhythm: Normal rate and regular rhythm.     Pulses: Normal pulses.     Heart  sounds: Normal heart sounds.  Pulmonary:     Effort: Pulmonary effort is normal.     Breath sounds: Normal breath sounds.  Neurological:     Mental Status: She is alert and oriented to person, place, and time. Mental status is at baseline.      UC Treatments / Results  Labs (all labs ordered are listed, but only abnormal results are displayed) Labs Reviewed  POC COVID19/FLU A&B COMBO    EKG   Radiology No results found.  Procedures Procedures (including critical care time)  Medications Ordered in UC Medications - No data to display  Initial Impression / Assessment and Plan / UC Course  I have reviewed the triage vital signs and the nursing notes.  Pertinent labs & imaging results that were available during my care of the patient were reviewed by me and considered in my medical decision making (see chart for details).  Viral illness  Patient is in no signs of distress nor toxic appearing.  Vital signs are stable.  Low suspicion for pneumonia, pneumothorax or bronchitis and therefore will defer imaging.  COVID and flu test negative. May use additional over-the-counter medications as needed for supportive care.  May follow-up with urgent care as needed if symptoms persist or worsen.   Final Clinical Impressions(s) / UC Diagnoses   Final diagnoses:  Viral illness   Discharge Instructions   None    ED Prescriptions   None    PDMP not reviewed this encounter.   Teresa Shelba SAUNDERS, NP 10/07/24 1018

## 2024-10-07 NOTE — Discharge Instructions (Addendum)
 Your symptoms today are most likely being caused by a virus and should steadily improve in time it can take up to 7 to 10 days before you truly start to see a turnaround however things will get better  Covid and flu test negative     You can take Tylenol and/or Ibuprofen as needed for fever reduction and pain relief.   For cough: honey 1/2 to 1 teaspoon (you can dilute the honey in water or another fluid).  You can also use guaifenesin and dextromethorphan for cough. You can use a humidifier for chest congestion and cough.  If you don't have a humidifier, you can sit in the bathroom with the hot shower running.      For sore throat: try warm salt water gargles, cepacol lozenges, throat spray, warm tea or water with lemon/honey, popsicles or ice, or OTC cold relief medicine for throat discomfort.   For congestion: take a daily anti-histamine like Zyrtec, Claritin, and a oral decongestant, such as pseudoephedrine.  You can also use Flonase 1-2 sprays in each nostril daily.   It is important to stay hydrated: drink plenty of fluids (water, gatorade/powerade/pedialyte, juices, or teas) to keep your throat moisturized and help further relieve irritation/discomfort.

## 2024-10-07 NOTE — ED Triage Notes (Signed)
 Patient reports sore throat, chills, dry cough and fatigue x 2 days. Rates sore throat pain 7/10. Patient took Ibuprofen with no relief.

## 2024-11-28 ENCOUNTER — Ambulatory Visit (INDEPENDENT_AMBULATORY_CARE_PROVIDER_SITE_OTHER)

## 2024-11-28 DIAGNOSIS — L300 Nummular dermatitis: Secondary | ICD-10-CM

## 2024-11-28 MED ORDER — TRIAMCINOLONE ACETONIDE 0.1 % EX OINT: TOPICAL_OINTMENT | CUTANEOUS | 2 refills | Status: AC

## 2024-11-28 MED ORDER — TACROLIMUS 0.1 % EX OINT: TOPICAL_OINTMENT | CUTANEOUS | 2 refills | Status: AC

## 2024-11-28 MED ORDER — CLOBETASOL PROPIONATE 0.05 % EX OINT: TOPICAL_OINTMENT | CUTANEOUS | 5 refills | Status: AC

## 2024-11-28 NOTE — Progress Notes (Signed)
" °  °  Subjective   Gina Gonzales is a 64 y.o. female who presents for the following: Rash. Patient is established patient   Today patient reports: Patient states she has coming and going rashes all over body. Was given Opzelura  cream and did not clear areas. Had an outbreak on her neck over the weekend and used her husbands kenazonazole  shampoo on body and slightly helped.  Review of Systems:    No other skin or systemic complaints except as noted in HPI or Assessment and Plan.  The following portions of the chart were reviewed this encounter and updated as appropriate: medications, allergies, medical history  Relevant Medical History:  rosacea  Objective  (SKPE) Well appearing patient in no apparent distress; mood and affect are within normal limits. 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 Stressed importance of breaking the itch-scratch cycle Discussed dry skin care at length, recommended avoidance of fragrances, short showers with luke- warm water, no scrubbing, an unscented moisturizing soap (e.g. Dove sensitive skin) limited to the groin and axillae, and frequent emollient use (Eucerin, Aquaphor, Cerave, Vanicream, Vaseline).  - For mild areas: start triamcinolone  ointment twice daily on body BID until smooth. - For severe areas: start clobetasol  ointment twice daily when flares not responding to triamcinolone  Use for up to 2 weeks. Avoid applying to face, groin, and axilla. Use as directed. Long-term use can cause thinning of the skin.  - For areas on face: start tacrolimus  ointment twice daily -Discussed  if not improving can consider biopsy or dupixent at follow up     Was sun protection counseling provided?: No   Level of service outlined above   Patient instructions (SKPI)   Procedures, orders, diagnosis for this visit:    There are no diagnoses linked to this encounter.  Return to clinic: Return in about 6 weeks (around 01/09/2025) for nummular dermatitis f/u , w/ Dr. Raymund.  I, Almetta Nora, RMA, am acting as scribe for Lauraine JAYSON Raymund, MD .   Documentation: I have reviewed the above documentation for accuracy and completeness, and I agree with the above.  Lauraine JAYSON Raymund, MD  "

## 2024-11-28 NOTE — Patient Instructions (Addendum)
 Atopic Dermatitis (Eczema)  PLAN: - Apply medicated ointment/cream to active areas (red/itchy/raised) 2x/day until CLEAR, then stop, restart as needed.  - For mild areas: start triamcinolone  ointment twice daily - For severe areas: start clobetasol  ointment twice daily - For areas on face: start tacrolimus  ointment twice daily  - Moisturize several times a day with an ointment (plain petroleum jelly/Vaseline, Aquaphor, coconut oil) or heavy cream (Vanicream, CereVe, Cetaphil, Eucerin) - Keep baths short (5-20min), limit soap as much as possible (Cetaphil gentle cleanser, vanicream bar, Dove Tip to Toe unscented, Trade Joe oatmeal and honey bar soap), apply moisturizers and medication immediately after bath/shower  Atopic dermatitis, also called eczema, is a common and chronic skin condition in which the skin appears inflamed, red, itchy and dry. It most commonly affects children.  Atopic dermatitis is most likely caused by a combination of genetic and environmental factors. Genetic causes include differences in the proteins that form the skin barrier. When this barrier is broken down, the skin loses moisture more easily, becoming more dry, easily irritated, and hypersensitive. The skin is also more prone to infection (with bacteria, viruses, or fungi). The immune system in the skin may be different and overreact to environmental triggers such as pet dander and dust mites.  Allergies and asthma may be present more frequently in individuals with atopic dermatitis, but they are not the cause of eczema. Infrequently, when a specific food allergy is identified, eating that food may make atopic dermatitis worse, but it usually is not the cause of the eczema.  In infants, atopic dermatitis often starts as a dry red rash on the cheeks and around the mouth, often made worse by drooling. As children grow older, the rash may be on the arms, legs, or in other areas where they are able to scratch. In  teenagers, eczema is often on the inside of the elbows and knees, on the hands and feet, and around the eyes.  There is no cure, but there are recommendations to help manage this skin problem.  TREATMENT  Treatments are aimed at preventing dry skin, treating the rash, improving the itch, and minimizing exposure to triggers.  1. GENTLE SKIN CARE TO PREVENT DRYNESS Bathe daily or every-other-day in order to wash off dirt and other potential irritants (the optimal frequency of bathing is not yet clear). Water should be warm (not hot), and bath time should be limited to 5-10 minutes. Pat-dry the skin and immediately apply moisturizer while the skin is still slightly damp. The moisturizer provides a seal to hold the water in the skin. Finding a cream or ointment that the child likes or can tolerate is important, as resistance from the child may make the daily regimen difficult to keep up. The thicker the moisturizer, the better the barrier it generally provides. Ointments are more effective than creams, and creams more so than lotions. Creams are a reasonable option during the summer when thick greasy ointments are uncomfortable.   2. TREATING THE RASH The most commonly used medications are topical corticosteroids (steroids). There are many different types of topical corticosteroids that come in different strengths and formulations (for example, ointments, creams, lotions, solutions, gels, oils). Therefore, finding the right combination for the individual is important to treat and to minimize the risk of unwanted side effects from the corticosteroid, such as skin thinning. In general, these topical corticosteroids should be applied as a thin layer and no more than twice daily. It is very unusual to see any side effects when  a topical corticosteroid is used as prescribed by your doctor. A relatively newer form of topical medication - in tacrolimus  ointment and pimecrolimus  cream - is also helpful,  particularly in thin-skinned areas such as the eyelids, armpits, and groin.* For severe and treatment-resistant cases of atopic dermatitis, systemic medications may be necessary. They may be associated with serious side effects and therefore require closer monitoring.  *The FDA placed a black-box warning on both tacrolimus  ointment and pimecrolimus  cream in 2006 based on animal studies using the medications. Some animals developed skin cancer and lymphoma. Subsequently, the FDA released a statement that there is no causal relationship between the two medications and cancer. Because of this concern, there are ongoing studies to evaluate this relationship in humans. So far, studies support the safety of these medications. One showed that the rates of cancer in patients using these medications topically were less than the rates of the general population; several studies have shown that the medicines are undetectable in the blood, even in children using the medication over a large area of the body.  3. TREATING THE [facility] Tell your physician if your child is very itchy or if the itch is affecting the ability to sleep. Oral anti-itch medicines (antihistamines) can be helpful for inducing sleep, but usually do not reduce the itch and scratching.  4. AVOIDING TRIGGERS Some children have specific things that trigger episodes of itchiness and rashes, while others may have none that can be identified. Triggers may even change over time. Common triggers include: excessive bathing without moisturization, low humidity, cigarette or wood smoke exposure, emotional stress, sweat, friction and overheating of skin, and exposure to certain products such as wool, harsh soaps, fragrance, bubble baths, and laundry detergents. Many parents and physicians consider allergy testing to identify possible triggers that could be avoided. There is limited utility for specific Immunoglobulin E (IgE) levels; if food allergy is being  considered as a trigger for the dermatitis (which is unusual), specific IgE levels are, at best, a guideline of potential allergic triggers and require food challenge testing to further consider the possibility.   5. RECOGNIZING INFECTIONS AS A TRIGGER Because the skin barrier is compromised, individuals with atopic dermatitis can also develop infections on the skin from bacteria, viruses, or fungi. The most common infection is from Staphylococcus aureus bacteria, which should be suspected when the skin develops honey-colored crusts, or appears raw and weepy. Infected skin may result in a worsening of the atopic dermatitis and may not respond to standard therapy. Diluted bleach baths can be helpful to reduce infection by S. aureus and thereby help better control atopic dermatitis. Some patients require oral and/or topical antibiotics or antiviral medications for these types of flares. Patients with atopic dermatitis may also be at risk for the spread on the skin of herpes virus; therefore, family and friends with a known or suspected history of herpes virus (cold sores, fever blisters, etc.) should avoid contacting patients with atopic dermatitis when they are having an active outbreak.   Contributing SPD Members: Alan Edin, MD, Deatrice Susette Bathe, MD, Margaretann Hasten, MD, Lauraine Favorite, MD, Scarlett Ness, MD, Cydney Robins, MD  Committee Reviewers: Daphne Sickles, MD, Prentice Grate, MD  Expert Reviewer: Greig Galley, MD  The Society for Pediatric Dermatology and [employer] cannot be held responsible for any errors or for any consequences arising from the use of the information contained in this handout. Handout originally published in Pediatric Dermatology: Vol. 33, No. 1 (2016).   2016 The  Society for Pediatric Dermatology   Moisturizer: Apply a moisturizer throughout the day and after bathing.  When you moisturize after bathing, this locks in the moisture.  This can lead to  softer and smoother skin.  Body moisturizers come in ointments, creams, and lotions.  If you have dry skin, we recommend the use of ointments or creams rather than lotions.  In other words, something you scoop out of a jar rather than squirted out.  Ointments and creams are thicker and thus provide better moisturization.       Steroid Use  We prescribed you a topical steroid at today's visit.   General application instructions: -Apply this to any affected skin areas, twice (2 times) daily, for two (2) weeks -If the areas are better, you can stop -Re-start the topical steroid if the areas come back, or flare -If the areas don't get better after two weeks, we sometimes recommend taking a break for one (1) week, before restarting for another two (2) weeks. Repeat as needed  The most common side effects of topical steroid medications include changes in skin pigment and thinning of the skin. If the steroid is only applied to affected areas of the skin, these effects rarely occur unless the steroid is used for a very long time (years without stopping).   If we prescribed you a strong steroid, please avoid applying to face, groin, or neck, unless we tell you otherwise. We will include more detail in your prescription instructions.   Atopic Dermatitis (Eczema)/ nummular dermatitis  PLAN: - Apply medicated ointment/cream to active areas (red/itchy/raised) 2x/day until CLEAR, then stop, restart as needed.  - For mild areas: start triamcinolone  ointment twice daily on body BID until smooth. - For severe areas: start clobetasol  ointment twice daily when flares not responding to triamcinolone  Use for up to 2 weeks. Avoid applying to face, groin, and axilla. Use as directed. Long-term use can cause thinning of the skin.  - For areas on face: start tacrolimus  ointment twice daily  - Moisturize several times a day with an ointment (plain petroleum jelly/Vaseline, Aquaphor, coconut oil) or heavy cream  (Vanicream, CereVe, Cetaphil, Eucerin) - Keep baths short (5-26min), limit soap as much as possible (Cetaphil gentle cleanser, vanicream bar, Dove Tip to Toe unscented, Trade Joe oatmeal and honey bar soap), apply moisturizers and medication immediately after bath/shower  Atopic dermatitis, also called eczema, is a common and chronic skin condition in which the skin appears inflamed, red, itchy and dry. It most commonly affects children.  Atopic dermatitis is most likely caused by a combination of genetic and environmental factors. Genetic causes include differences in the proteins that form the skin barrier. When this barrier is broken down, the skin loses moisture more easily, becoming more dry, easily irritated, and hypersensitive. The skin is also more prone to infection (with bacteria, viruses, or fungi). The immune system in the skin may be different and overreact to environmental triggers such as pet dander and dust mites.  Allergies and asthma may be present more frequently in individuals with atopic dermatitis, but they are not the cause of eczema. Infrequently, when a specific food allergy is identified, eating that food may make atopic dermatitis worse, but it usually is not the cause of the eczema.  In infants, atopic dermatitis often starts as a dry red rash on the cheeks and around the mouth, often made worse by drooling. As children grow older, the rash may be on the arms, legs, or in other areas  where they are able to scratch. In teenagers, eczema is often on the inside of the elbows and knees, on the hands and feet, and around the eyes.  There is no cure, but there are recommendations to help manage this skin problem.  TREATMENT  Treatments are aimed at preventing dry skin, treating the rash, improving the itch, and minimizing exposure to triggers.  1. GENTLE SKIN CARE TO PREVENT DRYNESS Bathe daily or every-other-day in order to wash off dirt and other potential irritants (the  optimal frequency of bathing is not yet clear). Water should be warm (not hot), and bath time should be limited to 5-10 minutes. Pat-dry the skin and immediately apply moisturizer while the skin is still slightly damp. The moisturizer provides a seal to hold the water in the skin. Finding a cream or ointment that the child likes or can tolerate is important, as resistance from the child may make the daily regimen difficult to keep up. The thicker the moisturizer, the better the barrier it generally provides. Ointments are more effective than creams, and creams more so than lotions. Creams are a reasonable option during the summer when thick greasy ointments are uncomfortable.   2. TREATING THE RASH The most commonly used medications are topical corticosteroids (steroids). There are many different types of topical corticosteroids that come in different strengths and formulations (for example, ointments, creams, lotions, solutions, gels, oils). Therefore, finding the right combination for the individual is important to treat and to minimize the risk of unwanted side effects from the corticosteroid, such as skin thinning. In general, these topical corticosteroids should be applied as a thin layer and no more than twice daily. It is very unusual to see any side effects when a topical corticosteroid is used as prescribed by your doctor. A relatively newer form of topical medication - in tacrolimus  ointment and pimecrolimus  cream - is also helpful, particularly in thin-skinned areas such as the eyelids, armpits, and groin.* For severe and treatment-resistant cases of atopic dermatitis, systemic medications may be necessary. They may be associated with serious side effects and therefore require closer monitoring.  *The FDA placed a black-box warning on both tacrolimus  ointment and pimecrolimus  cream in 2006 based on animal studies using the medications. Some animals developed skin cancer and lymphoma.  Subsequently, the FDA released a statement that there is no causal relationship between the two medications and cancer. Because of this concern, there are ongoing studies to evaluate this relationship in humans. So far, studies support the safety of these medications. One showed that the rates of cancer in patients using these medications topically were less than the rates of the general population; several studies have shown that the medicines are undetectable in the blood, even in children using the medication over a large area of the body.  3. TREATING THE [facility] Tell your physician if your child is very itchy or if the itch is affecting the ability to sleep. Oral anti-itch medicines (antihistamines) can be helpful for inducing sleep, but usually do not reduce the itch and scratching.  4. AVOIDING TRIGGERS Some children have specific things that trigger episodes of itchiness and rashes, while others may have none that can be identified. Triggers may even change over time. Common triggers include: excessive bathing without moisturization, low humidity, cigarette or wood smoke exposure, emotional stress, sweat, friction and overheating of skin, and exposure to certain products such as wool, harsh soaps, fragrance, bubble baths, and laundry detergents. Many parents and physicians consider allergy testing to  identify possible triggers that could be avoided. There is limited utility for specific Immunoglobulin E (IgE) levels; if food allergy is being considered as a trigger for the dermatitis (which is unusual), specific IgE levels are, at best, a guideline of potential allergic triggers and require food challenge testing to further consider the possibility.   5. RECOGNIZING INFECTIONS AS A TRIGGER Because the skin barrier is compromised, individuals with atopic dermatitis can also develop infections on the skin from bacteria, viruses, or fungi. The most common infection is from Staphylococcus aureus  bacteria, which should be suspected when the skin develops honey-colored crusts, or appears raw and weepy. Infected skin may result in a worsening of the atopic dermatitis and may not respond to standard therapy. Diluted bleach baths can be helpful to reduce infection by S. aureus and thereby help better control atopic dermatitis. Some patients require oral and/or topical antibiotics or antiviral medications for these types of flares. Patients with atopic dermatitis may also be at risk for the spread on the skin of herpes virus; therefore, family and friends with a known or suspected history of herpes virus (cold sores, fever blisters, etc.) should avoid contacting patients with atopic dermatitis when they are having an active outbreak.   Contributing SPD Members: Alan Edin, MD, Deatrice Susette Bathe, MD, Margaretann Hasten, MD, Lauraine Favorite, MD, Scarlett Ness, MD, Cydney Robins, MD  Committee Reviewers: Daphne Sickles, MD, Prentice Grate, MD  Expert Reviewer: Greig Galley, MD  The Society for Pediatric Dermatology and [employer] cannot be held responsible for any errors or for any consequences arising from the use of the information contained in this handout. Handout originally published in Pediatric Dermatology: Vol. 33, No. 1 (2016).   2016 The Society for Pediatric Dermatology   Moisturizer: Apply a moisturizer throughout the day and after bathing.  When you moisturize after bathing, this locks in the moisture.  This can lead to softer and smoother skin.  Body moisturizers come in ointments, creams, and lotions.  If you have dry skin, we recommend the use of ointments or creams rather than lotions.  In other words, something you scoop out of a jar rather than squirted out.  Ointments and creams are thicker and thus provide better moisturization.         Due to recent changes in healthcare laws, you may see results of your pathology and/or laboratory studies on MyChart before the  doctors have had a chance to review them. We understand that in some cases there may be results that are confusing or concerning to you. Please understand that not all results are received at the same time and often the doctors may need to interpret multiple results in order to provide you with the best plan of care or course of treatment. Therefore, we ask that you please give us  2 business days to thoroughly review all your results before contacting the office for clarification. Should we see a critical lab result, you will be contacted sooner.   If You Need Anything After Your Visit  If you have any questions or concerns for your doctor, please call our main line at [phone] and press option 4 to reach your doctor's medical assistant. If no one answers, please leave a voicemail as directed and we will return your call as soon as possible. Messages left after 4 pm will be answered the following business day.   You may also send us  a message via MyChart. We typically respond to MyChart messages within 1-2 business  days.  For prescription refills, please ask your pharmacy to contact our office. Our fax number is [phone].  If you have an urgent issue when the clinic is closed that cannot wait until the next business day, you can page your doctor at the number below.    Please note that while we do our best to be available for urgent issues outside of office hours, we are not available 24/7.   If you have an urgent issue and are unable to reach us , you may choose to seek medical care at your doctor's office, retail clinic, urgent care center, or emergency room.  If you have a medical emergency, please immediately call 911 or go to the emergency department.  Pager Numbers  - Dr. Hester: [phone]  - Dr. Jackquline: [phone]  - Dr. Claudene: [phone]   - Dr. Raymund: [phone]  In the event of inclement weather, please call our main line at [phone] for an update on the  status of any delays or closures.  Dermatology Medication Tips: Please keep the boxes that topical medications come in in order to help keep track of the instructions about where and how to use these. Pharmacies typically print the medication instructions only on the boxes and not directly on the medication tubes.   If your medication is too expensive, please contact our office at [phone] option 4 or send us  a message through MyChart.   We are unable to tell what your co-pay for medications will be in advance as this is different depending on your insurance coverage. However, we may be able to find a substitute medication at lower cost or fill out paperwork to get insurance to cover a needed medication.   If a prior authorization is required to get your medication covered by your insurance company, please allow us  1-2 business days to complete this process.  Drug prices often vary depending on where the prescription is filled and some pharmacies may offer cheaper prices.  The website www.goodrx.com contains coupons for medications through different pharmacies. The prices here do not account for what the cost may be with help from insurance (it may be cheaper with your insurance), but the website can give you the price if you did not use any insurance.  - You can print the associated coupon and take it with your prescription to the pharmacy.  - You may also stop by our office during regular business hours and pick up a GoodRx coupon card.  - If you need your prescription sent electronically to a different pharmacy, notify our office through [facility] or by phone at [phone] option 4.     Si Usted Necesita Algo Despus de Su Visita  Tambin puede enviarnos un mensaje a travs de Clinical Cytogeneticist. Por lo general respondemos a los mensajes de MyChart en el transcurso de 1 a 2 das hbiles.  Para renovar recetas, por favor pida a su farmacia que se ponga en contacto con nuestra oficina.  [place] de fax es [place] [phone].  Si tiene un asunto urgente cuando la clnica est cerrada y que no puede esperar hasta el siguiente da hbil, puede llamar/localizar a su doctor(a) al nmero que aparece a continuacin.   Por favor, tenga en cuenta que aunque hacemos todo lo posible para estar disponibles para asuntos urgentes fuera del horario de [place], no estamos disponibles las 24 horas del da, los 7 [address]  [address] de la [place].   Si tiene un problema urgente y no puede comunicarse con nosotros,  puede optar por buscar atencin mdica  en el consultorio de su doctor(a), en una clnica privada, en un centro de atencin urgente o en una sala de emergencias.  Si tiene engineer, drilling, por favor llame inmediatamente al 911 o vaya a la sala de emergencias.  Nmeros de bper  - Dr. Hester: [phone]  - Dra. Jackquline: [phone]  - Dr. Claudene: [phone]  - Dra. Ramil [place]: [phone]  En caso de inclemencias del [place], por favor llame a nuestra lnea principal al [phone] para una actualizacin sobre el estado de cualquier retraso o cierre.  Consejos para la medicacin en dermatologa: Por favor, guarde las cajas en las que vienen los medicamentos de uso tpico para ayudarle a seguir las instrucciones sobre dnde y cmo usarlos. Las farmacias generalmente imprimen las instrucciones del medicamento slo en las cajas y no directamente en los tubos del [place].   Si su medicamento es muy caro, por favor, pngase en contacto con landry rieger llamando al [phone] y presione la opcin 4 o envenos un mensaje a travs de Clinical Cytogeneticist.   No podemos decirle cul ser su copago por los medicamentos por adelantado ya que esto es diferente dependiendo de la cobertura de su seguro. Sin embargo, es posible que podamos encontrar un medicamento sustituto a audiological scientist un formulario para que el seguro cubra el medicamento que se considera necesario.   Si se requiere una  autorizacin previa para que su compaa de seguros cubra su medicamento, por favor permtanos de 1 a 2 das hbiles para completar este proceso.  Los precios de los medicamentos varan con frecuencia dependiendo del environmental consultant de dnde se surte la receta y alguna farmacias pueden ofrecer precios ms baratos.  El sitio web www.goodrx.com tiene cupones para medicamentos de health and safety inspector. Los precios aqu no tienen en cuenta lo que podra costar con la ayuda del seguro (puede ser ms barato con su seguro), pero el sitio web puede darle el precio si no utiliz tourist information centre manager.  - Puede imprimir el cupn correspondiente y llevarlo con su receta a la farmacia.  - [employer] puede pasar por nuestra oficina durante el horario de atencin regular y education officer, museum una tarjeta de cupones de [employer].  - Si necesita que su receta se enve electrnicamente a una farmacia diferente, informe a nuestra oficina a travs de [facility] o por telfono llamando al [phone] y presione la opcin 4.

## 2024-12-31 ENCOUNTER — Ambulatory Visit: Admitting: Dermatology

## 2025-01-16 ENCOUNTER — Ambulatory Visit

## 2025-02-25 ENCOUNTER — Ambulatory Visit: Admitting: Dermatology

## 2025-08-26 ENCOUNTER — Ambulatory Visit: Admitting: Dermatology
